# Patient Record
Sex: Female | Born: 2000 | Hispanic: Yes | Marital: Single | Smoking: Never smoker
Health system: Southern US, Community
[De-identification: ages and names within clinical notes are randomized; demographics above are authoritative.]

## PROBLEM LIST (undated history)

## (undated) ENCOUNTER — Inpatient Hospital Stay (HOSPITAL_COMMUNITY): Payer: Self-pay

## (undated) DIAGNOSIS — J45909 Unspecified asthma, uncomplicated: Secondary | ICD-10-CM

## (undated) DIAGNOSIS — H9190 Unspecified hearing loss, unspecified ear: Secondary | ICD-10-CM

## (undated) DIAGNOSIS — IMO0001 Reserved for inherently not codable concepts without codable children: Secondary | ICD-10-CM

## (undated) HISTORY — PX: NO PAST SURGERIES: SHX2092

---
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
## 2021-01-19 ENCOUNTER — Emergency Department (HOSPITAL_BASED_OUTPATIENT_CLINIC_OR_DEPARTMENT_OTHER): Payer: Medicaid Other

## 2021-01-19 ENCOUNTER — Emergency Department (HOSPITAL_BASED_OUTPATIENT_CLINIC_OR_DEPARTMENT_OTHER)
Admission: EM | Admit: 2021-01-19 | Discharge: 2021-01-19 | Disposition: A | Discharge status: Home / Self Care | Payer: Medicaid Other | Attending: Emergency Medicine | Admitting: Emergency Medicine

## 2021-01-19 ENCOUNTER — Encounter (HOSPITAL_BASED_OUTPATIENT_CLINIC_OR_DEPARTMENT_OTHER): Payer: Self-pay

## 2021-01-19 ENCOUNTER — Other Ambulatory Visit: Payer: Self-pay

## 2021-01-19 DIAGNOSIS — S6991XA Unspecified injury of right wrist, hand and finger(s), initial encounter: Secondary | ICD-10-CM | POA: Diagnosis present

## 2021-01-19 DIAGNOSIS — S61212A Laceration without foreign body of right middle finger without damage to nail, initial encounter: Secondary | ICD-10-CM | POA: Insufficient documentation

## 2021-01-19 DIAGNOSIS — W232XXA Caught, crushed, jammed or pinched between a moving and stationary object, initial encounter: Secondary | ICD-10-CM | POA: Diagnosis not present

## 2021-01-19 DIAGNOSIS — Z23 Encounter for immunization: Secondary | ICD-10-CM | POA: Diagnosis not present

## 2021-01-19 DIAGNOSIS — Y990 Civilian activity done for income or pay: Secondary | ICD-10-CM | POA: Diagnosis not present

## 2021-01-19 DIAGNOSIS — S61209A Unspecified open wound of unspecified finger without damage to nail, initial encounter: Secondary | ICD-10-CM

## 2021-01-19 DIAGNOSIS — J45909 Unspecified asthma, uncomplicated: Secondary | ICD-10-CM | POA: Diagnosis not present

## 2021-01-19 MED ORDER — BUPIVACAINE HCL (PF) 0.5 % IJ SOLN
10.0000 mL | Freq: Once | INTRAMUSCULAR | Status: AC
  Administered 2021-01-19: 10 mL
  Filled 2021-01-19: qty 10

## 2021-01-19 MED ORDER — CEPHALEXIN 500 MG PO CAPS: 500.0000 mg | ORAL_CAPSULE | Freq: Four times a day (QID) | ORAL | 0 refills | Status: AC

## 2021-01-19 MED ORDER — CEFAZOLIN SODIUM-DEXTROSE 1-4 GM/50ML-% IV SOLN
1.0000 g | Freq: Once | INTRAVENOUS | Status: AC
  Administered 2021-01-19: 1 g via INTRAVENOUS
  Filled 2021-01-19: qty 50

## 2021-01-19 MED ORDER — TETANUS-DIPHTH-ACELL PERTUSSIS 5-2.5-18.5 LF-MCG/0.5 IM SUSY
0.5000 mL | PREFILLED_SYRINGE | Freq: Once | INTRAMUSCULAR | Status: AC
  Administered 2021-01-19: 0.5 mL via INTRAMUSCULAR
  Filled 2021-01-19: qty 0.5

## 2021-01-19 MED ORDER — SODIUM CHLORIDE 0.9 % IV SOLN: INTRAVENOUS | Status: DC | PRN

## 2021-01-19 NOTE — ED Triage Notes (Signed)
Pt reports she got right middle finger caught in a gate at work ~1pm-partial tip avulsion with nail involvement-dsg 4x4/kling placed in triage-bleeding controled-NAD-steady gait

## 2021-01-19 NOTE — ED Provider Notes (Signed)
[facility] EMERGENCY DEPARTMENT Provider Note   CSN: [id number] Arrival date & time: 01/19/21  1329     History Chief Complaint  Patient presents with   Finger Injury    Crystal Jennings is a 20 y.o. female.  HPI  Patient is a 20 year old female who presents to the emergency department due to a laceration to the right middle finger.  Patient states this occurred at work earlier today.  They are a large metal doors at her work and her finger was caught between 2 doors and crushed the distal tip of the finger resulting in a laceration.  Reports mild bleeding from the site.  Reports decreased sensation to the avulsed tip of the finger.  Notes fingernail involvement.  Unsure of the timing of her last Tdap.  No other complaints.     Past Medical History:  Diagnosis Date   Asthma    Hearing impairment     There are no problems to display for this patient.   History reviewed. No pertinent surgical history.   OB History   No obstetric history on file.     No family history on file.  Social History   Tobacco Use   Smoking status: Never   Smokeless tobacco: Never  Vaping Use   Vaping Use: Never used  Substance Use Topics   Alcohol use: Never   Drug use: Never    Home Medications Prior to Admission medications   Medication Sig Start Date End Date Taking? Authorizing Provider  cephALEXin (KEFLEX) 500 MG capsule Take 1 capsule (500 mg total) by mouth 4 (four) times daily for 7 days. 01/19/21 01/26/21 Yes Placido Sou, PA-C    Allergies    Patient has no known allergies.  Review of Systems   Review of Systems  All other systems reviewed and are negative. Ten systems reviewed and are negative for acute change, except as noted in the HPI.   Physical Exam Updated Vital Signs BP 115/74   Pulse 73   Temp 98.4 F (36.9 C) (Oral)   Resp 16   Ht 5\' 2"  (1.575 m)   Wt 83.5 kg   LMP 01/09/2021   SpO2 100%   BMI 33.65 kg/m   Physical  Exam Vitals and nursing note reviewed.  Constitutional:      General: She is not in acute distress.    Appearance: Normal appearance. She is not ill-appearing, toxic-appearing or diaphoretic.  HENT:     Head: Normocephalic and atraumatic.     Right Ear: External ear normal.     Left Ear: External ear normal.     Nose: Nose normal.     Mouth/Throat:     Mouth: Mucous membranes are moist.     Pharynx: Oropharynx is clear. No oropharyngeal exudate or posterior oropharyngeal erythema.  Eyes:     Extraocular Movements: Extraocular movements intact.  Cardiovascular:     Rate and Rhythm: Normal rate and regular rhythm.     Pulses: Normal pulses.     Heart sounds: Normal heart sounds. No murmur heard.   No friction rub. No gallop.  Pulmonary:     Effort: Pulmonary effort is normal. No respiratory distress.     Breath sounds: Normal breath sounds. No stridor. No wheezing, rhonchi or rales.  Abdominal:     General: Abdomen is flat.     Tenderness: There is no abdominal tenderness.  Musculoskeletal:        General: Normal range of motion.  Cervical back: Normal range of motion and neck supple. No tenderness.  Skin:    General: Skin is warm and dry.     Comments: Please see images below of the right middle finger.  2+ radial pulses.  Subjective decrease in sensation along the distal tip of the finger.  Neurological:     General: No focal deficit present.     Mental Status: She is alert and oriented to person, place, and time.  Psychiatric:        Mood and Affect: Mood normal.        Behavior: Behavior normal.        ED Results / Procedures / Treatments   Labs (all labs ordered are listed, but only abnormal results are displayed) Labs Reviewed - No data to display  EKG None  Radiology DG Finger Middle Right  Result Date: 01/19/2021 CLINICAL DATA:  Injury Third digit caught in gait at work Partial tip avulsion EXAM: RIGHT MIDDLE FINGER 2+V COMPARISON:  None. FINDINGS:  Deformity of the tip of the third digit consistent with provided history of laceration/amputation. Small avulsion type fracture noted at the distal tip of the distal phalanx of the third digit. IMPRESSION: Small avulsion fracture of the distal tip of the distal phalanx of the third digit. Electronically Signed   By: Acquanetta Belling M.D.   On: 01/19/2021 14:10    Procedures Procedures   Medications Ordered in ED Medications  0.9 %  sodium chloride infusion (0 mLs Intravenous Stopped 01/19/21 1818)  bupivacaine (MARCAINE) 0.5 % injection 10 mL (10 mLs Infiltration Given by Other 01/19/21 1538)  Tdap (BOOSTRIX) injection 0.5 mL (0.5 mLs Intramuscular Given 01/19/21 1539)  bupivacaine (MARCAINE) 0.5 % injection 10 mL (10 mLs Infiltration Given by Other 01/19/21 1606)  ceFAZolin (ANCEF) IVPB 1 g/50 mL premix (0 g Intravenous Stopped 01/19/21 1818)   ED Course  I have reviewed the triage vital signs and the nursing notes.  Pertinent labs & imaging results that were available during my care of the patient were reviewed by me and considered in my medical decision making (see chart for details).  Clinical Course as of 01/19/21 1818  Thu Jan 19, 2021  1712 Patient discussed with Dr. Yehuda Budd with hand surgery.  He reviewed patient's imaging as well as the images of her injury that I placed in her chart.  Recommends that I remove the avulsed portion of the fingernail, clean the finger, and approximate with Tegaderm and wrapped it with Coban.  Request that the patient reach out to him first thing tomorrow morning to schedule an appointment for reevaluation. [LJ]    Clinical Course User Index [LJ] Placido Sou, PA-C   MDM Rules/Calculators/A&P                          Patient is a 20 year old female who presents to the emergency department due to what appears to be an avulsion laceration to the distal tip of the right middle finger.  This occurred at work after a metal machine door crushed the end of her  finger.  X-rays obtained showing small avulsion fracture of the distal tip of the distal phalanx of the third digit.  Please see images above of the wound.  Patient with 2+ radial pulses.  Full range of motion of the finger.  Patient does report decreased sensation along the avulsed tip of the finger.  Patient discussed with Dr. Yehuda Budd who is on-call with hand surgery.  He reviewed her images and recommends the wound be cleaned extensively and covered with Tegaderm and wrapped with Coban.  Recommends follow-up in his office tomorrow.  Wound cleaned extensively with sterile saline as well as Betadine.  Patient given 1 g of Ancef.  After cleaning the wound it appears that the avulsed tip of the fingernail has already been removed.  Wound covered with Tegaderm and wrapped with Coban.  Patient tolerated the procedure quite well.  Analgesia achieved with digital block.  Feel the patient is stable for discharge at this time and she is agreeable.  Will discharge on a course of Keflex.  Patient given the contact information for Dr. Yehuda Budd and urged to reach out to his office first thing tomorrow morning.  Her questions were answered and she was amicable at the time of discharge.  Final Clinical Impression(s) / ED Diagnoses Final diagnoses:  Avulsion of finger, initial encounter    Rx / DC Orders ED Discharge Orders          Ordered    cephALEXin (KEFLEX) 500 MG capsule  4 times daily        01/19/21 1808             Placido Sou, PA-C 01/19/21 1818    Arby Barrette, MD 01/24/21 1104

## 2021-01-19 NOTE — Discharge Instructions (Signed)
I am prescribing you an antibiotic called Keflex.  Please take this 4 times a day for the next week.  Do not stop taking this antibiotic early.  Below is the contact information for Dr. Yehuda Budd.  He is a Producer, television/film/video.  Please call him first thing tomorrow morning to schedule an appointment for reevaluation.  His office is expecting your call.  If you develop any new or worsening symptoms please come back to the emergency department.  It was a pleasure to meet you.

## 2021-06-15 ENCOUNTER — Other Ambulatory Visit: Payer: Self-pay

## 2021-06-15 ENCOUNTER — Emergency Department (HOSPITAL_COMMUNITY)
Admission: EM | Admit: 2021-06-15 | Discharge: 2021-06-15 | Disposition: A | Discharge status: Home / Self Care | Payer: Medicaid Other | Attending: Emergency Medicine | Admitting: Emergency Medicine

## 2021-06-15 DIAGNOSIS — H9201 Otalgia, right ear: Secondary | ICD-10-CM | POA: Insufficient documentation

## 2021-06-15 DIAGNOSIS — Z20822 Contact with and (suspected) exposure to covid-19: Secondary | ICD-10-CM | POA: Insufficient documentation

## 2021-06-15 DIAGNOSIS — J039 Acute tonsillitis, unspecified: Secondary | ICD-10-CM

## 2021-06-15 DIAGNOSIS — J029 Acute pharyngitis, unspecified: Secondary | ICD-10-CM | POA: Diagnosis not present

## 2021-06-15 LAB — RESP PANEL BY RT-PCR (FLU A&B, COVID) ARPGX2
Influenza A by PCR: NEGATIVE
Influenza B by PCR: NEGATIVE
SARS Coronavirus 2 by RT PCR: NEGATIVE

## 2021-06-15 LAB — GROUP A STREP BY PCR: Group A Strep by PCR: NOT DETECTED

## 2021-06-15 MED ORDER — DEXAMETHASONE SODIUM PHOSPHATE 10 MG/ML IJ SOLN
10.0000 mg | Freq: Once | INTRAMUSCULAR | Status: AC
Start: 2021-06-15 — End: 2021-06-15
  Administered 2021-06-15: 10 mg via INTRAMUSCULAR
  Filled 2021-06-15: qty 1

## 2021-06-15 MED ORDER — LIDOCAINE VISCOUS HCL 2 % MT SOLN
15.0000 mL | Freq: Once | OROMUCOSAL | Status: AC
Start: 2021-06-15 — End: 2021-06-15
  Administered 2021-06-15: 15 mL via OROMUCOSAL
  Filled 2021-06-15: qty 15

## 2021-06-15 MED ORDER — KETOROLAC TROMETHAMINE 60 MG/2ML IM SOLN
60.0000 mg | Freq: Once | INTRAMUSCULAR | Status: AC
  Administered 2021-06-15: 60 mg via INTRAMUSCULAR
  Filled 2021-06-15: qty 2

## 2021-06-15 MED ORDER — IBUPROFEN 800 MG PO TABS: 800.0000 mg | ORAL_TABLET | Freq: Three times a day (TID) | ORAL | 0 refills | Status: DC

## 2021-06-15 NOTE — ED Provider Notes (Signed)
?[facility] EMERGENCY DEPARTMENT ?Provider Note ? ? ?CSN: [id number] ?Arrival date & time: 06/15/21  0746 ? ?  ? ?History ? ?Chief Complaint  ?Patient presents with  ? Sore Throat  ? ? ?Crystal Jennings is a 21 y.o. female who presents emergency department with chief complaint of sore throat.  She had onset of sore throat yesterday.  Her girlfriend who is at bedside states that she looked in the back of her throat and noticed that her tonsils were big.  This morning she came in for further evaluation.  She has some referred pain in her right ear and pain with swallowing and has noticed a change in her voice.  She denies fever, chills, nausea, vomiting, exposure to contacts with similar symptoms.  She does not have a history of strep throat. ? ? ?Sore Throat ?Pertinent negatives include no shortness of breath.  ? ?  ? ?Home Medications ?Prior to Admission medications   ?Not on File  ?   ? ?Allergies    ?Patient has no known allergies.   ? ?Review of Systems   ?Review of Systems  ?Constitutional:  Negative for fever.  ?HENT:  Positive for sore throat and trouble swallowing.   ?Respiratory:  Negative for choking, shortness of breath, wheezing and stridor.   ? ?Physical Exam ?Updated Vital Signs ?BP 121/84 (BP Location: Right Arm)   Pulse 99   Temp 98 ?F (36.7 ?C) (Oral)   Resp 16   SpO2 100%  ?Physical Exam ?Vitals and nursing note reviewed.  ?Constitutional:   ?   General: She is not in acute distress. ?   Appearance: She is well-developed. She is not diaphoretic.  ?HENT:  ?   Head: Normocephalic and atraumatic.  ?   Right Ear: External ear normal.  ?   Left Ear: External ear normal.  ?   Nose: Nose normal.  ?   Mouth/Throat:  ?   Mouth: Mucous membranes are moist.  ?   Pharynx: Pharyngeal swelling, oropharyngeal exudate and posterior oropharyngeal erythema present. No uvula swelling.  ?Eyes:  ?   General: No scleral icterus. ?   Conjunctiva/sclera: Conjunctivae normal.  ?Cardiovascular:   ?   Rate and Rhythm: Normal rate and regular rhythm.  ?   Heart sounds: Normal heart sounds. No murmur heard. ?  No friction rub. No gallop.  ?Pulmonary:  ?   Effort: Pulmonary effort is normal. No respiratory distress.  ?   Breath sounds: Normal breath sounds.  ?Abdominal:  ?   General: Bowel sounds are normal. There is no distension.  ?   Palpations: Abdomen is soft. There is no mass.  ?   Tenderness: There is no abdominal tenderness. There is no guarding.  ?Musculoskeletal:  ?   Cervical back: Normal range of motion.  ?Skin: ?   General: Skin is warm and dry.  ?Neurological:  ?   Mental Status: She is alert and oriented to person, place, and time.  ?Psychiatric:     ?   Behavior: Behavior normal.  ? ? ?ED Results / Procedures / Treatments   ?Labs ?(all labs ordered are listed, but only abnormal results are displayed) ?Labs Reviewed  ?GROUP A STREP BY PCR  ?RESP PANEL BY RT-PCR (FLU A&B, COVID) [id number]  ? ? ?EKG ?None ? ?Radiology ?No results found. ? ?Procedures ?Procedures  ? ? ?Medications Ordered in ED ?Medications  ?dexamethasone (DECADRON) injection 10 mg (10 mg Intramuscular Given 06/15/21 0854)  ?ketorolac (TORADOL) injection  60 mg (60 mg Intramuscular Given 06/15/21 0854)  ?lidocaine (XYLOCAINE) 2 % viscous mouth solution 15 mL (15 mLs Mouth/Throat Given 06/15/21 0854)  ? ? ?ED Course/ Medical Decision Making/ A&P ?Clinical Course as of 06/15/21 0927  ?Thu Jun 15, 2021  ?0926 Resp Panel by RT-PCR (Flu A&B, Covid) Nasopharyngeal Swab [AH]  ?0973 Group A Strep by PCR ?Respiratory panel and group A strep negative [AH]  ?  ?Clinical Course User Index ?[AH] Arthor Captain, PA-C  ? ?                        ?Medical Decision Making ?Patient here with tonsillar swelling and exudate. ?No strep detected.  Patient had significant relief with Decadron, Toradol and viscous lidocaine.  The patient has no evidence of peritonsillar abscess.  Patient will be discharged with symptomatic treatment. ? ?Amount and/or Complexity  of Data Reviewed ?Labs: ordered. Decision-making details documented in ED Course. ? ?Risk ?Prescription drug management. ? ? ? ? ? ?  ? ? ? ? ?Final Clinical Impression(s) / ED Diagnoses ?Final diagnoses:  ?None  ? ? ?Rx / DC Orders ?ED Discharge Orders   ? ? None  ? ?  ? ? ?  ?Arthor Captain, PA-C ?06/15/21 5329 ? ?  ?Franne Forts, DO ?06/16/21 [phone] ? ?

## 2021-06-15 NOTE — ED Triage Notes (Signed)
Pt from home for sore throat/swollen tonsils since yesterday. Endorse pain that radiates to R ear.  ?

## 2021-06-15 NOTE — Discharge Instructions (Addendum)
Contact a health care provider if: You notice large, tender lumps in your neck that were not there before. You have a fever that does not go away after 2-3 days. You develop a rash. You cough up a green, yellow-brown, or bloody substance. You cannot swallow liquids or food for 24 hours. Only one of your tonsils is swollen. Get help right away if: You develop any new symptoms, such as vomiting, severe headache, stiff neck, chest pain, trouble breathing, or trouble swallowing. You have severe throat pain along with drooling or voice changes. You have severe pain that is not controlled with medicines. You cannot fully open your mouth. You develop redness, swelling, or severe pain anywhere in your neck. 

## 2022-09-25 ENCOUNTER — Emergency Department (HOSPITAL_COMMUNITY)
Admission: EM | Admit: 2022-09-25 | Discharge: 2022-09-25 | Disposition: A | Discharge status: Home / Self Care | Payer: Medicaid Other | Attending: Emergency Medicine | Admitting: Emergency Medicine

## 2022-09-25 ENCOUNTER — Encounter (HOSPITAL_COMMUNITY): Payer: Self-pay

## 2022-09-25 DIAGNOSIS — Z1152 Encounter for screening for COVID-19: Secondary | ICD-10-CM | POA: Diagnosis not present

## 2022-09-25 DIAGNOSIS — R251 Tremor, unspecified: Secondary | ICD-10-CM | POA: Diagnosis present

## 2022-09-25 LAB — URINALYSIS, ROUTINE W REFLEX MICROSCOPIC
Glucose, UA: NEGATIVE mg/dL
Hgb urine dipstick: NEGATIVE
Ketones, ur: NEGATIVE mg/dL
Nitrite: NEGATIVE
Protein, ur: 30 mg/dL — AB
Specific Gravity, Urine: 1.029 (ref 1.005–1.030)
pH: 5 (ref 5.0–8.0)

## 2022-09-25 LAB — CBC WITH DIFFERENTIAL/PLATELET
Abs Immature Granulocytes: 0.05 10*3/uL (ref 0.00–0.07)
Basophils Absolute: 0.1 10*3/uL (ref 0.0–0.1)
Basophils Relative: 1 %
Eosinophils Absolute: 0.1 10*3/uL (ref 0.0–0.5)
Eosinophils Relative: 1 %
HCT: 43.2 % (ref 36.0–46.0)
Hemoglobin: 14.5 g/dL (ref 12.0–15.0)
Immature Granulocytes: 0 %
Lymphocytes Relative: 40 %
Lymphs Abs: 4.5 10*3/uL — ABNORMAL HIGH (ref 0.7–4.0)
MCH: 29.2 pg (ref 26.0–34.0)
MCHC: 33.6 g/dL (ref 30.0–36.0)
MCV: 87.1 fL (ref 80.0–100.0)
Monocytes Absolute: 1 10*3/uL (ref 0.1–1.0)
Monocytes Relative: 9 %
Neutro Abs: 5.6 10*3/uL (ref 1.7–7.7)
Neutrophils Relative %: 49 %
Platelets: 404 10*3/uL — ABNORMAL HIGH (ref 150–400)
RBC: 4.96 MIL/uL (ref 3.87–5.11)
RDW: 13.9 % (ref 11.5–15.5)
WBC: 11.4 10*3/uL — ABNORMAL HIGH (ref 4.0–10.5)
nRBC: 0 % (ref 0.0–0.2)

## 2022-09-25 LAB — COMPREHENSIVE METABOLIC PANEL
ALT: 36 U/L (ref 0–44)
AST: 34 U/L (ref 15–41)
Albumin: 3.9 g/dL (ref 3.5–5.0)
Alkaline Phosphatase: 61 U/L (ref 38–126)
Anion gap: 10 (ref 5–15)
BUN: 9 mg/dL (ref 6–20)
CO2: 26 mmol/L (ref 22–32)
Calcium: 9.3 mg/dL (ref 8.9–10.3)
Chloride: 102 mmol/L (ref 98–111)
Creatinine, Ser: 0.92 mg/dL (ref 0.44–1.00)
GFR, Estimated: >60 mL/min (ref >60)
Glucose, Bld: 98 mg/dL (ref 70–99)
Potassium: 3.6 mmol/L (ref 3.5–5.1)
Sodium: 138 mmol/L (ref 135–145)
Total Bilirubin: 1 mg/dL (ref 0.3–1.2)
Total Protein: 7.5 g/dL (ref 6.5–8.1)

## 2022-09-25 LAB — LIPASE, BLOOD: Lipase: 29 U/L (ref 11–51)

## 2022-09-25 LAB — SARS CORONAVIRUS 2 BY RT PCR: SARS Coronavirus 2 by RT PCR: NEGATIVE

## 2022-09-25 LAB — PREGNANCY, URINE: Preg Test, Ur: NEGATIVE

## 2022-09-25 NOTE — ED Provider Notes (Signed)
[facility] EMERGENCY DEPARTMENT AT [facility] Provider Note   CSN: [id number] Arrival date & time: 09/25/22  1530     History  Chief Complaint  Patient presents with   Shaking    Crystal Jennings is a 22 y.o. female with no past medical history presented with multiple concerns that began yesterday.  Patient states that she was very anxious going to work and began shaking.  The shaking was on the entire day and patient states this was from her anxiety.  Patient and her friend deny seizure-like activity or history of seizures.  Patient also notes that she has acid reflux but is not on any medications for it.  Patient states that her acid reflux is intermittent and has been present for the past few weeks but denies any chest pain.  Patient denies recent illness, fevers, chest pain, shortness of breath, LOC, blood thinners, new medications, drug use, thyroid issues, alcohol use, no history of seizures  Home Medications Prior to Admission medications   Medication Sig Start Date End Date Taking? Authorizing Provider  ibuprofen (ADVIL) 800 MG tablet Take 1 tablet (800 mg total) by mouth 3 (three) times daily. 06/15/21   Arthor Captain, PA-C      Allergies    Patient has no known allergies.    Review of Systems   Review of Systems See HPI Physical Exam Updated Vital Signs BP 113/66   Pulse (!) 58   Temp 98 F (36.7 C) (Oral)   Resp 16   SpO2 99%  Physical Exam Vitals reviewed.  Constitutional:      General: She is not in acute distress.    Comments: On phone  HENT:     Head: Normocephalic and atraumatic.  Eyes:     Extraocular Movements: Extraocular movements intact.     Conjunctiva/sclera: Conjunctivae normal.     Pupils: Pupils are equal, round, and reactive to light.     Comments: No protruding eyes  Neck:     Comments: No goiter Cardiovascular:     Rate and Rhythm: Normal rate and regular rhythm.     Pulses: Normal pulses.     Heart sounds:  Normal heart sounds.     Comments: 2+ bilateral radial/dorsalis pedis pulses with regular rate Pulmonary:     Effort: Pulmonary effort is normal. No respiratory distress.     Breath sounds: Normal breath sounds.  Abdominal:     Palpations: Abdomen is soft.     Tenderness: There is no abdominal tenderness. There is no guarding or rebound.  Musculoskeletal:        General: Normal range of motion.     Cervical back: Normal range of motion and neck supple.     Comments: 5 out of 5 bilateral grip/leg extension strength  Skin:    General: Skin is warm and dry.     Capillary Refill: Capillary refill takes less than 2 seconds.  Neurological:     General: No focal deficit present.     Mental Status: She is alert and oriented to person, place, and time.     Comments: Sensation intact in all 4 limbs No tremors noted  Psychiatric:        Mood and Affect: Mood normal.     ED Results / Procedures / Treatments   Labs (all labs ordered are listed, but only abnormal results are displayed) Labs Reviewed  URINALYSIS, ROUTINE W REFLEX MICROSCOPIC - Abnormal; Notable for the following components:  Result Value   Color, Urine AMBER (*)    APPearance TURBID (*)    Bilirubin Urine SMALL (*)    Protein, ur 30 (*)    Leukocytes,Ua SMALL (*)    Bacteria, UA RARE (*)    All other components within normal limits  CBC WITH DIFFERENTIAL/PLATELET - Abnormal; Notable for the following components:   WBC 11.4 (*)    Platelets 404 (*)    Lymphs Abs 4.5 (*)    All other components within normal limits  SARS CORONAVIRUS 2 BY RT PCR  PREGNANCY, URINE  COMPREHENSIVE METABOLIC PANEL  LIPASE, BLOOD    EKG None  Radiology No results found.  Procedures Procedures    Medications Ordered in ED Medications - No data to display  ED Course/ Medical Decision Making/ A&P                             Medical Decision Making Amount and/or Complexity of Data Reviewed Labs: ordered.   Crystal C  Jennings 21 y.o. presented today for shaking. Working DDx that I considered at this time includes, but not limited to, anxiety, electrolyte abnormalities, thyroid abnormalities, viral illness, UTI, Parkinson's, alcohol withdrawal.  R/o DDx: electrolyte abnormalities, thyroid abnormalities, viral illness, UTI, Parkinson's, alcohol withdrawal: These are considered less likely due to history of present illness and physical exam findings  Review of prior external notes: 06/15/2021 ED  Unique Tests and My Interpretation:  COVID: Pending Lipase: Unremarkable CMP: Unremarkable CBC: Unremarkable Urine pregnancy: Negative UA: Unremarkable  Discussion with Independent Historian:  Friend  Discussion of Management of Tests: None  Risk: Low: based on diagnostic testing/clinical impression and treatment plan  Risk Stratification Score: None  Plan: On exam patient was in no acute distress stable vitals.  Patient physical was unremarkable as she had no abdominal tenderness and did not have any tremors or other concerning features on her exam.  Patient stated that she has history of acid reflux but is not currently having any symptoms at this moment.  Patient thinks that her shaking yesterday was due to anxiety as she states she was super anxious about going into work and that the shaking stopped after she left work.  Patient's labs came back reassuring and I spoke to the patient and she states that she is good to be discharged and needs to be given primary care provider to follow-up with.  Patient given primary care follow-up and encouraged to return to ER if symptoms change or worsen.  I encouraged patient to follow-up on the MyChart app in terms of her COVID results.  Patient was given return precautions. Patient stable for discharge at this time.  Patient verbalized understanding of plan.         Final Clinical Impression(s) / ED Diagnoses Final diagnoses:  Tremor    Rx / DC  Orders ED Discharge Orders     None         Remi Deter 09/25/22 2152    Crystal Grandchild, MD 09/26/22 2221

## 2022-09-25 NOTE — ED Notes (Signed)
Patient arrived to room ambulatory with steady gait. Patient reports after work today she had "shaking" spell not like a seizure just arm shaking. Patient states 3 week hx of heartburn and decreased appetite. Patient denies n/v/d and has no abdominal pain. Bowel sounds audible in all quads. Patient a/o x 4 respirations even and non labored.

## 2022-09-25 NOTE — ED Triage Notes (Signed)
Pt states yesterday when she got off work she had a spell of shaking but not like a seizure, and has had a reduction in her appetite. Also complains of some intermittent heart burn over the past several weeks. No Hx of diabetes.

## 2022-09-25 NOTE — Discharge Instructions (Signed)
Please follow-up with a primary care provider of your choosing within 1 day provide here for you in regards to your recent ER visit and symptoms.  Today your labs are reassuring and you may follow-up on the COVID results on the MyChart app.  Please monitor your symptoms and if symptoms change or worsen please return to ER.

## 2023-01-06 IMAGING — CR DG FINGER MIDDLE 2+V*R*
3 series · 3 of 3 positions shown · non-contrast
Comparison: None.

CLINICAL DATA: Injury

Third digit caught in gait at work
Partial tip avulsion
EXAM:
RIGHT MIDDLE FINGER 2+V

[x finger pa right]
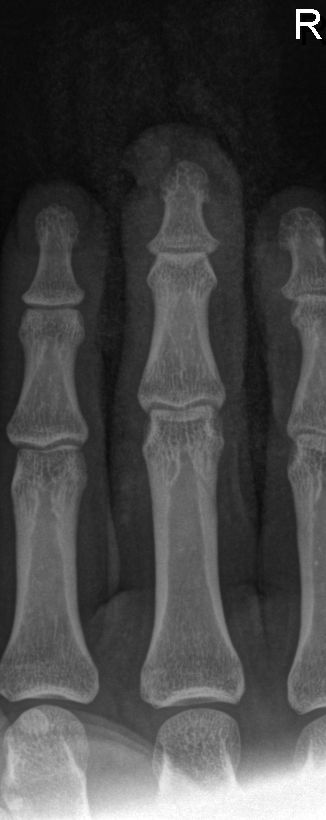

[x finger obl. right]
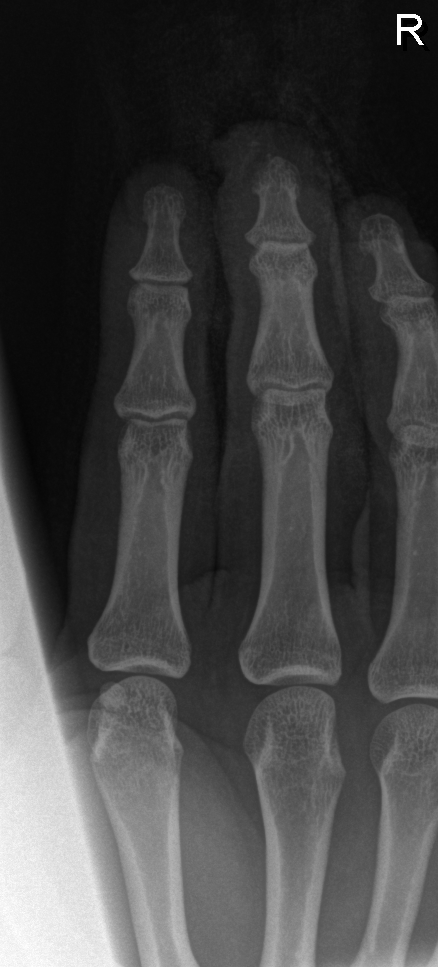

[x finger lateral right]
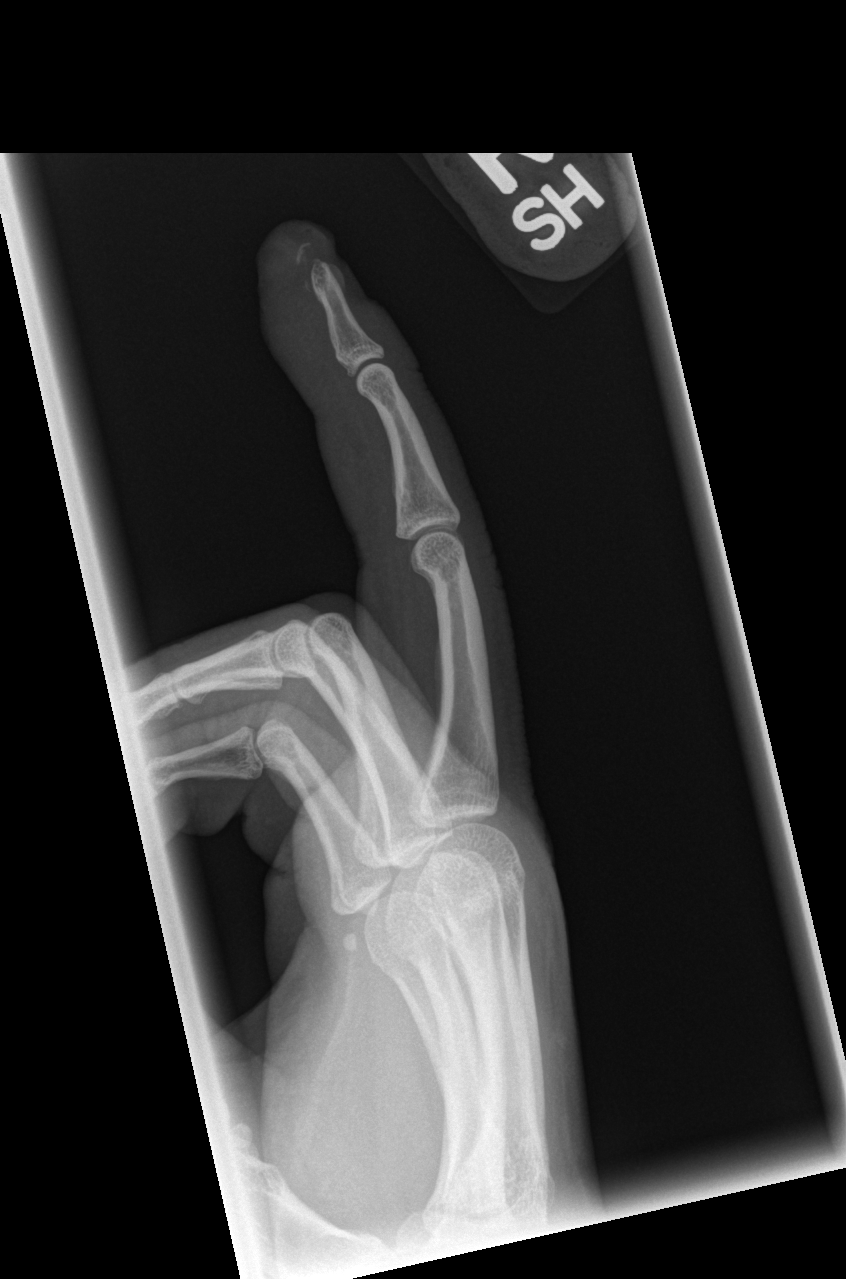

[3 of 3 positions shown; findings below may reference images not displayed]

FINDINGS: Deformity of the tip of the third digit consistent with provided
history of laceration/amputation.

Small avulsion type fracture noted at the distal tip of the distal
phalanx of the third digit.
IMPRESSION: Small avulsion fracture of the distal tip of the distal phalanx of
the third digit.

## 2023-01-13 ENCOUNTER — Emergency Department (HOSPITAL_COMMUNITY)
Admission: EM | Admit: 2023-01-13 | Discharge: 2023-01-13 | Disposition: A | Discharge status: Home / Self Care | Payer: Medicaid Other | Attending: Student | Admitting: Student

## 2023-01-13 DIAGNOSIS — S0990XA Unspecified injury of head, initial encounter: Secondary | ICD-10-CM | POA: Insufficient documentation
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
## 2023-01-13 NOTE — Discharge Instructions (Addendum)
You may have a concussion. You  may take Tylenol 650 mg every 4 hours as needed for headache. It is very important that you rest over the next 48 hours. You should also not participate in any exercise or sports for at least 7 days and until you have no symptoms including headache, dizziness, nausea, or vomiting. Once your symptoms have resolved and you have gone at least 7 days following your injury, you may gradually return to light exercise as directed by your regular primary care provider. Return to the emergency department for repeat evaluation if you develop more than 2 episodes of vomiting, worsening headache despite use of Tylenol, difficulties with speech or balance, or other new concerns.

## 2023-03-20 NOTE — L&D Delivery Note (Addendum)
 OB/GYN Faculty Practice Delivery Note  Crystal Jennings is a 23 y.o. G1P0 s/p NSVD at [redacted]w[redacted]d. She was admitted for SOL.   ROM: 4h 66m with meconium stained fluid GBS Status:  Positive/-- (06/30 1427) Maximum Maternal Temperature:  Temp (24hrs), Avg:97.9 F (36.6 C), Min:97.5 F (36.4 C), Max:98.3 F (36.8 C)    Labor Progress: Patient arrived at 5.5 cm dilation and was followed with expectant management. SROM at 0600. Pt had a prolonged decel after getting the epidural and got terb.   Delivery Date/Time: 09/30/2023 at 1031 Delivery: Called to room and patient was complete and pushing. Head delivered in ROA position. Loose double  nuchal cord present. Shoulder and body delivered in usual fashion. Infant with spontaneous cry, placed on mother's abdomen, dried and stimulated. Cord clamped x 2 after 1-minute delay, and cut by provider. Cord blood drawn. Placenta delivered spontaneously with gentle cord traction. Fundus firm with massage and Pitocin . Labia, perineum, vagina, and cervix inspected. CNM was present and supervised to the entire delivery.   Placenta: manual removal, intact Complications: none  Lacerations: 2nd degree, repaired with 3-0 vicryl on a CT-1 in the usual fashion EBL: 376cc Analgesia: epidural    Infant: APGAR (1 MIN): 7  APGAR (5 MINS): 9  APGAR (10 MINS):    Weight: pending  Chiquita Clover, MD  PGY-3 [facility] Resident 09/30/2023 11:06 AM  I was present for the entire delivery of baby and placenta and performed manual removal of placenta and perineal repair and agree with above.  Claudene Windell Musson , CNM 09/30/2023 12:02 PM

## 2023-07-17 ENCOUNTER — Encounter

## 2023-07-22 ENCOUNTER — Ambulatory Visit (INDEPENDENT_AMBULATORY_CARE_PROVIDER_SITE_OTHER): Admitting: Obstetrics

## 2023-07-22 ENCOUNTER — Other Ambulatory Visit: Payer: Self-pay

## 2023-07-22 ENCOUNTER — Other Ambulatory Visit

## 2023-07-22 ENCOUNTER — Other Ambulatory Visit (HOSPITAL_COMMUNITY)
Admission: RE | Admit: 2023-07-22 | Discharge: 2023-07-22 | Disposition: A | Discharge status: Home / Self Care | Source: Ambulatory Visit | Attending: Obstetrics | Admitting: Obstetrics

## 2023-07-22 ENCOUNTER — Encounter: Payer: Self-pay | Admitting: Obstetrics

## 2023-07-22 VITALS — BP 125/88 | HR 81 | Wt 191.8 lb

## 2023-07-22 DIAGNOSIS — Z3480 Encounter for supervision of other normal pregnancy, unspecified trimester: Secondary | ICD-10-CM

## 2023-07-22 DIAGNOSIS — O09513 Supervision of elderly primigravida, third trimester: Secondary | ICD-10-CM

## 2023-07-22 DIAGNOSIS — O0990 Supervision of high risk pregnancy, unspecified, unspecified trimester: Secondary | ICD-10-CM | POA: Diagnosis not present

## 2023-07-22 DIAGNOSIS — J301 Allergic rhinitis due to pollen: Secondary | ICD-10-CM

## 2023-07-22 DIAGNOSIS — Z31430 Encounter of female for testing for genetic disease carrier status for procreative management: Secondary | ICD-10-CM

## 2023-07-22 MED ORDER — CETIRIZINE HCL 10 MG PO TABS: 10.0000 mg | ORAL_TABLET | Freq: Every day | ORAL | 11 refills | Status: DC

## 2023-07-22 MED ORDER — PREPLUS 27-1 MG PO TABS
1.0000 | ORAL_TABLET | Freq: Every day | ORAL | 13 refills | Status: AC
Start: 2023-07-22 — End: ?

## 2023-07-22 NOTE — Progress Notes (Signed)
 Pt presents for new ob.pt has no questions or concerns at this time.

## 2023-07-22 NOTE — Progress Notes (Signed)
 Subjective:    Crystal Jennings is being seen today for her first obstetrical visit.  This is not a planned pregnancy. She is at Unknown gestation. Her obstetrical history is significant for  none . Relationship with FOB: significant other, not living together. Patient does intend to breast feed. Pregnancy history fully reviewed.  The information documented in the HPI was reviewed and verified.  Menstrual History: OB History     Gravida  1   Para      Term      Preterm      AB      Living         SAB      IAB      Ectopic      Multiple      Live Births               Patient's last menstrual period was 12/18/2022.    Past Medical History:  Diagnosis Date   Asthma    Hearing impairment     History reviewed. No pertinent surgical history.  (Not in a hospital admission)  No Known Allergies  Social History   Tobacco Use   Smoking status: Never   Smokeless tobacco: Never  Substance Use Topics   Alcohol use: Never    History reviewed. No pertinent family history.   Review of Systems Constitutional: negative for weight loss Gastrointestinal: negative for vomiting Genitourinary:negative for genital lesions and vaginal discharge and dysuria Musculoskeletal:negative for back pain Behavioral/Psych: negative for abusive relationship, depression, illegal drug usage and tobacco use    Objective:    BP 125/88   Pulse 81   Wt 191 lb 12.8 oz (87 kg)   LMP 12/18/2022   BMI 35.08 kg/m  General Appearance:    Alert, cooperative, no distress, appears stated age  Head:    Normocephalic, without obvious abnormality, atraumatic  Eyes:    PERRL, conjunctiva/corneas clear, EOM's intact, fundi    benign, both eyes  Ears:    Normal TM's and external ear canals, both ears  Nose:   Nares normal, septum midline, mucosa normal, no drainage    or sinus tenderness  Throat:   Lips, mucosa, and tongue normal; teeth and gums normal  Neck:   Supple, symmetrical,  trachea midline, no adenopathy;    thyroid:  no enlargement/tenderness/nodules; no carotid   bruit or JVD  Back:     Symmetric, no curvature, ROM normal, no CVA tenderness  Lungs:     Clear to auscultation bilaterally, respirations unlabored  Chest Wall:    No tenderness or deformity   Heart:    Regular rate and rhythm, S1 and S2 normal, no murmur, rub   or gallop  Breast Exam:    No tenderness, masses, or nipple abnormality  Abdomen:     Soft, non-tender, bowel sounds active all four quadrants,    no masses, no organomegaly  Genitalia:    Normal female without lesion, discharge or tenderness  Extremities:   Extremities normal, atraumatic, no cyanosis or edema  Pulses:   2+ and symmetric all extremities  Skin:   Skin color, texture, turgor normal, no rashes or lesions  Lymph nodes:   Cervical, supraclavicular, and axillary nodes normal  Neurologic:   CNII-XII intact, normal strength, sensation and reflexes    throughout      Lab Review Urine pregnancy test Labs reviewed yes Radiologic studies reviewed yes  Assessment:   1. Supervision of high risk pregnancy, antepartum (Primary)  Rx: - Glucose Tolerance, 2 Hours w/1 Hour - CBC - HIV antibody (with reflex) - RPR - Cytology - PAP( [name]) - Cervicovaginal ancillary only( [name]) - Culture, OB Urine - CBC/D/Plt+RPR+Rh+ABO+RubIgG... - US  MFM OB DETAIL +14 WK; Future - Prenatal Vit-Fe Fumarate-FA (PREPLUS) 27-1 MG TABS; Take 1 tablet by mouth daily.  Dispense: 30 tablet; Refill: 13  2. Primigravida of advanced maternal age in third trimester  3. Encounter of female for testing for genetic disease carrier status for procreative management Rx: - HORIZON Basic Panel - PANORAMA PRENATAL TEST  4. Seasonal allergic rhinitis due to pollen Rx: - cetirizine (ZYRTEC) 10 MG tablet; Take 1 tablet (10 mg total) by mouth daily.  Dispense: 30 tablet; Refill: 11    Plan:    Follow up in 2 weeks  Prenatal vitamins.   Counseling provided regarding continued use of seat belts, cessation of alcohol consumption, smoking or use of illicit drugs; infection precautions i.e., influenza/TDAP immunizations, toxoplasmosis,CMV, parvovirus, listeria and varicella; workplace safety, exercise during pregnancy; routine dental care, safe medications, sexual activity, hot tubs, saunas, pools, travel, caffeine use, fish and methlymercury, potential toxins, hair treatments, varicose veins Weight gain recommendations per IOM guidelines reviewed: underweight/BMI< 18.5--> gain 28 - 40 lbs; normal weight/BMI 18.5 - 24.9--> gain 25 - 35 lbs; overweight/BMI 25 - 29.9--> gain 15 - 25 lbs; obese/BMI >30->gain  11 - 20 lbs Problem list reviewed and updated. FIRST/CF mutation testing/NIPT/QUAD SCREEN/fragile X/Ashkenazi Jewish population testing/Spinal muscular atrophy discussed: requested. Role of ultrasound in pregnancy discussed; fetal survey: requested. Amniocentesis discussed: not indicated.  Meds ordered this encounter  Medications   Prenatal Vit-Fe Fumarate-FA (PREPLUS) 27-1 MG TABS    Sig: Take 1 tablet by mouth daily.    Dispense:  30 tablet    Refill:  13   cetirizine (ZYRTEC) 10 MG tablet    Sig: Take 1 tablet (10 mg total) by mouth daily.    Dispense:  30 tablet    Refill:  11   Orders Placed This Encounter  Procedures   Culture, OB Urine   US  MFM OB DETAIL +14 WK    BMI 35, Late to care approximately 26 wks, needs dating    Standing Status:   Future    Expiration Date:   07/21/2024    Reason for Exam (SYMPTOM  OR DIAGNOSIS REQUIRED):   Anatomy scan    Preferred Location:   [facility] Ultrasound   Glucose Tolerance, 2 Hours w/1 Hour   CBC   HIV antibody (with reflex)   RPR   CBC/D/Plt+RPR+Rh+ABO+RubIgG...   HORIZON Basic Panel    ==========Department Information========== ID: [id number] Department:CENTER FOR [facility] FOR [facility] HEALTHCARE AT [facility] [address],  [address] [place] [address] Dept: [phone] Dept Fax: [phone]     Specify the name or ID of a valid Horizon Custom Panel::   HBASIC    Is patient pregnant?:   Yes    Practice ensures that HIPAA consent is obtained and will make available to [facility] upon request?:   Yes    By placing this electronic order I confirm the testing ordered herein is medically necessary and this patient has been informed of the details of the genetic test(s) ordered, including the risks, benefits, and alternatives, and has consented to testing.:   Yes    What type of billing?:   [employer] an  order diagnosis: For additional options refer to [email or web address]:   Encounter of female for testing for genetic disease carrier status for procreative management [phone]    Tay-Sachs add-on test?:   No   PANORAMA PRENATAL TEST    ==========Department Information========== ID: [id number] Department:[facility]ENTER FOR [facility] HEALTHCARE AT [facility] [address], [address] [place] [address] Dept: [phone] Dept Fax: [phone]     Method/type of collection::   Clinic to manage sample collection    Expected due date (MM/DD/YYYY)::   10/28/2023    Is this a twin pregnancy? 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  I have spent a total of 30 minutes of face-to-face time, excluding clinical staff time, reviewing notes and preparing to see patient, ordering tests and/or medications, and counseling the patient.    Gabrielle Joiner, MD, FACOG Attending Obstetrician & Gynecologist, [facility] for [facility], [facility], [place] 07/22/2023

## 2023-07-23 LAB — GLUCOSE TOLERANCE, 2 HOURS W/ 1HR
Glucose, 1 hour: 107 mg/dL (ref 70–179)
Glucose, 2 hour: 96 mg/dL (ref 70–152)
Glucose, Fasting: 76 mg/dL (ref 70–91)

## 2023-07-24 DIAGNOSIS — O0990 Supervision of high risk pregnancy, unspecified, unspecified trimester: Secondary | ICD-10-CM | POA: Insufficient documentation

## 2023-07-24 LAB — CBC/D/PLT+RPR+RH+ABO+RUBIGG...
Antibody Screen: NEGATIVE
Basophils Absolute: 0.1 10*3/uL (ref 0.0–0.2)
Basos: 1 %
EOS (ABSOLUTE): 0.2 10*3/uL (ref 0.0–0.4)
Eos: 1 %
HCV Ab: NONREACTIVE
HIV Screen 4th Generation wRfx: NONREACTIVE
Hematocrit: 36.1 % (ref 34.0–46.6)
Hemoglobin: 11.9 g/dL (ref 11.1–15.9)
Hepatitis B Surface Ag: NEGATIVE
Immature Grans (Abs): 0 10*3/uL (ref 0.0–0.1)
Immature Granulocytes: 0 %
Lymphocytes Absolute: 3.7 10*3/uL — ABNORMAL HIGH (ref 0.7–3.1)
Lymphs: 30 %
MCH: 27.9 pg (ref 26.6–33.0)
MCHC: 33 g/dL (ref 31.5–35.7)
MCV: 85 fL (ref 79–97)
Monocytes Absolute: 1 10*3/uL — ABNORMAL HIGH (ref 0.1–0.9)
Monocytes: 8 %
Neutrophils Absolute: 7.4 10*3/uL — ABNORMAL HIGH (ref 1.4–7.0)
Neutrophils: 60 %
Platelets: 334 10*3/uL (ref 150–450)
RBC: 4.27 x10E6/uL (ref 3.77–5.28)
RDW: 12.7 % (ref 11.7–15.4)
RPR Ser Ql: NONREACTIVE
Rh Factor: POSITIVE
Rubella Antibodies, IGG: 1.41 {index} (ref >0.99)
WBC: 12.4 10*3/uL — ABNORMAL HIGH (ref 3.4–10.8)

## 2023-07-24 LAB — CYTOLOGY - PAP: Diagnosis: NEGATIVE

## 2023-07-24 LAB — CERVICOVAGINAL ANCILLARY ONLY
Chlamydia: NEGATIVE
Comment: NEGATIVE
Comment: NORMAL
Neisseria Gonorrhea: NEGATIVE

## 2023-07-24 LAB — HCV INTERPRETATION

## 2023-07-27 LAB — PANORAMA PRENATAL TEST FULL PANEL:PANORAMA TEST PLUS 5 ADDITIONAL MICRODELETIONS: FETAL FRACTION: 22.2

## 2023-07-29 ENCOUNTER — Encounter: Payer: Self-pay | Admitting: Family Medicine

## 2023-07-31 LAB — HORIZON CUSTOM: REPORT SUMMARY: NEGATIVE

## 2023-08-05 ENCOUNTER — Ambulatory Visit: Admitting: Obstetrics and Gynecology

## 2023-08-05 ENCOUNTER — Encounter: Payer: Self-pay | Admitting: Obstetrics and Gynecology

## 2023-08-05 VITALS — BP 120/77 | HR 89 | Wt 191.1 lb

## 2023-08-05 DIAGNOSIS — Z3A32 32 weeks gestation of pregnancy: Secondary | ICD-10-CM | POA: Diagnosis not present

## 2023-08-05 DIAGNOSIS — Z23 Encounter for immunization: Secondary | ICD-10-CM | POA: Diagnosis not present

## 2023-08-05 DIAGNOSIS — O0933 Supervision of pregnancy with insufficient antenatal care, third trimester: Secondary | ICD-10-CM | POA: Diagnosis not present

## 2023-08-05 DIAGNOSIS — F32A Depression, unspecified: Secondary | ICD-10-CM

## 2023-08-05 DIAGNOSIS — Z9152 Personal history of nonsuicidal self-harm: Secondary | ICD-10-CM

## 2023-08-05 DIAGNOSIS — O0930 Supervision of pregnancy with insufficient antenatal care, unspecified trimester: Secondary | ICD-10-CM | POA: Insufficient documentation

## 2023-08-05 DIAGNOSIS — O99343 Other mental disorders complicating pregnancy, third trimester: Secondary | ICD-10-CM | POA: Diagnosis not present

## 2023-08-05 DIAGNOSIS — Z3403 Encounter for supervision of normal first pregnancy, third trimester: Secondary | ICD-10-CM
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
## 2023-08-05 NOTE — Progress Notes (Signed)
 Reports back pain, vomiting, and headache that started on Friday. Back pain and vomiting has since resolved. Pt would like to discuss medication for depression that she took prior to pregnancy.

## 2023-08-05 NOTE — Patient Instructions (Addendum)
 [facility] Pediatric Providers  Please ask if the group is: Taking new patients Takes your insurance  [place]outheast [place] ([address]) [facility] Bevin Bucks, MD; Lanetta Pion, MD; [place]ville Lax, MD; Bronson Canny, MD; McDiarmid, MD; Floretta Huron, MD [address] [place] [place], [place] [address] [phone] Mon-Fri 8:30-12:30, 1:30-5:00  Providers come to see babies during newborn hospitalization Only accepting infants of Mother's who are seen at [facility] or have siblings seen at   [facility] Medicaid - Yes; Tricare - Yes   [facility] [place], MD [address], [place], [place] [address] [phone] Mon, Tue, Thur, Fri 8:30-5:00, Wed 10:00-7:00 (closed 1-2pm daily for lunch) [facility] residents with no insurance.  [place] [employer] only with Medicaid/insurance; Tricare - no  [facility]) - [facility], MD; Bevin Bucks, MD; Deeann Fare, MD; Johnathan Myron, MD; Norberta Beans, MD; Charon Copper, MD; Particia Bolus, MD; Rochelle Chu,  MD; Julietta Ogren, MD; Maebelle Schmid, MD; Silvestre Drum, MD; Stuart Ellis, MD; Arvel Lather, MD; Rina Cheek, NP [address] [place], [place], [place] [address] [phone] Mon, Tue, Thur, Fri 8:30-5:30, Wed 9:30-5:30, Sat 8:30-12:30 Only accepting infants of first-time parents or siblings of current patients Hospital discharge coordinator will make follow-up appointment Medicaid - yes; Tricare - yes  [place] [phone]) [facility] Mauro Sox, MD; Nolon Baxter, MD; Marjie Sieve, MD; Ree Candy, MD; Asencion Blacksmith, MD; [facility], MD; Marrianne Six, MD; Anell Keep, MD; Broadus Canes, MD [address], [place], [place] [address] [phone] Mon-Fri 8:30-5:00, closed for lunch 12:30-1:30; Sat-Sun 10:00-1:00 Accepting Newborns with commercial insurance only, must call prior to delivery to be accepted into  practice.  Medicaid - no, Tricare - yes   [facility] [address] [place], [place]  [address] [phone] or [phone] Mon to Fri 8am to 10pm, Sat 8am to 1pm (virtual only on weekends) Only accepts Medicaid Healthy Blue pts  [facility]) - Pediatrics at Cari Char, MD; Theodore Fisher, MD; Leonard Raker, MD; Rejeana Card, NP; Dominica Friends, MD; Skipper Dumas, MD [address] [place], [place] [address] [phone] Mon-Fri 8:30-5:30 Medicaid - yes, Tricare - yes  [place] [phone]) [facility] Billing, MD [address], [place], [place] [address] [phone] Roxana Copier, Wed Fri 8:30-5:00, Sat 8:30-12:00, Closed Thursdays Accepting siblings of established patients and first time mom's if you call prenatally Medicaid- yes; Tricare - yes  [facility] Merry [place], [place]; Barbar Bonus, MD; Genice Kettle; Scifres, PA; Paulene Boron, MD; Janifer Meigs, MD;  [address], [place], [place] [address] [phone] Mon-Fri 8:30-5:00, closed for lunch 1-2 Only accepting newborns of established patients Medicaid- no; Tricare - yes  [facility] [phone]) [facility] at Mellissa Sprinkles, MD; [address], [place], [place] [address] [phone] Mon-Fri 8:00-5:00 Medicaid - No; Tricare - Yes  [facility], [place]; [place], [place] [address] [place], [place] [address] [phone] Mon-Fri 8:00-5:00 Medicaid - No, Tricare - Yes  [facility] Freddi Jaeger, MD; Aviva Boer, MD; [place], [place] [address] [place], [place] [address] [phone]  Mon-Fri 8:00-5:00 Medicaid - No; Tricare - Yes  [facility] [address] [place], [place] [address] [phone] Mon-Fri 8:30-5:00 (lunch 12:00-1:00) Medicaid -Yes; Tricare - Yes  [facility] [place], MD [address] [place], [place], [place] [address] [phone] Mon-Fri 8:00-5:00 Seeing newborns of current patients only. No new patients Medicaid -  No, Tricare - yes  Nature conservation officer at [place] Kermit Ped, MD [address] [place], [place] [address] [phone] Mon-Fri 8:00-5:00 Medicaid -yes as secondary coverage only; Tricare - yes  [facility] [place], [place]; [place], NP; Arsenio Bigger, MD; Melanee Spire, MD; Doc Freed, MD; [place], [place]; Smoot, NP; Kimber Pencil, MD; [place], MD [address] [place], [place] [address] [phone] Mon-Fri 8:30-5:00, Sat 9:00-11:00 Accepts commercial insurance ONLY. Offers free prenatal information sessions for families. Medicaid - No, Tricare - Call first  [facility] [place], MD; [place], [place]; [place], [place]; [place], [place] [address] [place] [address] [phone] Mon-Fri 7:30-5:30 Medicaid - Yes; Duaine German yes  [place] [phone] & [phone])  [facility], MD [address], [place], [place] [address] [phone] Mon-Thur 8:00-6:00, closed for lunch 12-2, closed Fridays Medicaid - yes; Tricare - no  [facility] Alva Jewels, NP; Charmel Cooter, MD; [place], [place]; [place], [place] [address], [place], [place] [address] [phone] Mon-Fri 7:30-4:30 Medicaid - yes, Tricare - yes  [facility]  Jules Oar, MD; Arleta Bench, NP; Rhea Cecil, MD; Clarke Crouch, NP [address], [place], [place] [address] [phone] Mon-Fri 8:30-5:00, closed for lunch 1-2, Sat 8:30-12:00 - sick visits only Providers come to see babies at [facility] Only accepting newborns of siblings and first time parents ONLY if who have met with office prior to delivery Medicaid -Yes; Tricare - yes  [facility] - [place], [place]; Tamar Fairly, NP; Lajuana Pilar, MD; Rachel Budds, MD:  [address], [place], [place] [address] [phone] Mon- Fri 8:00-5:00, Sat 9:00-12:00 - sick visits only Accepting siblings of established patients and first time mom/baby Medicaid - Yes; Tricare - yes Patients must  have vaccinations (baby vaccines)  [place] [phone] & [phone])  Adult nurse HealthCare at [facility] [address] [place], [place] [address] [phone] Mon-Fri 8:00-5:00 Medicaid - no; Tricare - yes  [facility] [place], MD; [place], [place]; [place], [place] [address], [place], [place] [address] [phone] Mon-Fri 8:00-5:00 Medicaid- yes; Tricare - yes  [facility] - Sharmon Dec, MD; Rochelle Chu, NP; [place], [place] [address] [place], [place], [place] [address] [phone] Mon-Fri 8:00-5:00 Medicaid - Yes; Tricare - yes  [address] [place] [phone])  [facility] [place], [place]; [place], [place]; Encarnacion Harris, MD; Mills Alma, MD; [place], NP; Isenhour, DO; [place], [place]; Arabella Beach, MD; Kenyon Pears, MD; Valda Garnet, MD; [place], [place]; [place], [place]; [place], [place] 8527 [facility] [address], [place], [place] [address] [phone] Mon-Fri 8:30-5:00, Sat 9:00-12:00 - sick only Please register online [email or web address] then schedule online or call office Medicaid-Yes; Tricare -yes  [facility] - Premier  [place], MD; Ellwood Haber, MD; [place], MD; [place], NP; [place], [place]; Blain Bulls, MD; Micael Adas, NP; Stephenie Einstein, MD [address], [place], [place] [address] [phone] Mon-Fri 8:00-5:30, Sat&Sun by appointment (phones open at 8:30) Medicaid - Yes; Tricare - yes  [place] [phone] & [phone]) [facility] Randalyn Bushman; [place], MD; Carolin Chyle, MD; Monta Anton, NP; [place], DO [address], [place], [place] [address] [phone] M-F 8:00 - 5:15, Sat/Sun 9-12 sick visits only Medicaid - No; Tricare - yes  [facility]  Minto, PA-C; Twilight, PA-C; [place], DO; Kaylor, PA-C; Smith Dunk; Lila Regal, MD [address], [place], [place] [address] [phone] Mon-Thur 8:00-7:00, Fri 8:00-5:00 Accepting Medicaid  for 13 and under only   [facility] - Family Medicine at [facility]  [facility] [place] [place], [place]; List, FNP; Gertha Ku, MD; Gilmer Lady, PA-C; Governor Laundry, MD; Lyda Samples, FNP; [place], FNP; Gaspar Karma, MD; Gertha Ku, MD [phone] [address] [place], [place] [address] [phone] Mon-Fri 8:30-5:30 Medicaid - Yes; Tricare - yes  [facility] - [address]  [place], [place]; Johnnette Nakayama, MD; Jerryl Morin, MD; Nancyann Aye, MD; Hoyt, NP [address], [place], [place] [address] [phone] Mon-Thur 8:00-5:30, Fri 8:00-5:00, Sat 9:00-12:00 Medicaid - yes, Tricare - yes  [place] [phone])  [facility], [place]; Enrique Harvest, MD; [place], [place] [address], [place], [place] [address] [phone] Mon-Fri 8:00-5:00, closed for lunch 12-1 Medicaid - No; Tricare - yes  Nature conservation officer at [facility], MD [address] [place], [place] [address] [phone] Mon-Fri 8:00-5:00 Medicaid - No; Tricare - yes  [facility], MD; Lulu Sales, MD; Delmas Ferrari, MD; Rochelle Chu, MD [address] [facility] [address], [place], [place] [address] [phone] Mon-Fri 8:00-5:00 Medicaid- Yes; Tricare - yes  [place] [phone])  Adult nurse HealthCare at [place], [place]; [place], MD [address], [place], [place] [address] [phone] Mon-Fri 8:00-5:00 Medicaid - No; Tricare - yes  [facility] - Teddy Fear - CPNP [address], [place], [place] [address] [phone] Mon-Weds 8:00-6:00, Thurs-Fri 8:00-5:00, Sat 9:00-12:00 Medicaid - yes; Tricare - yes   [facility] Lyndal Sandy, MD; [place], [place] [address] [place], [place] [address] [phone] Mon-Fri 8:00-5:00 Medicaid - yes; Tricare - yes  [facility] Pediatric Providers  [facility] [address] [place], [place] [address] [phone] Emmette Harms: 8am -8pm, Tues, Weds: 8am - 5pm; Fri: 8-1 Medicaid - Yes; Tricare - yes  [facility] Kandis Ormond, MD; Lincoln Renshaw, MD; Karlynn Oyster, MD; [place], [place]; [place], [place] [address], [place], [place] [address] [phone] M-F 8:30 - 5:00 Medicaid - Call office; Tricare -yes  [facility] Abbott Hockey, MD; Glyn Laser, MD, Viann Graces, MD; Benedetto Brady, PNP; Abner Ables, NP [phone] [address], [place], [place] [address] [phone] M-F 8:30 - 5:00, Sat/Sun 8:30 - 12:30 (sick visits) Medicaid - Call office; Tricare -yes  [facility] Harles Lied, MD; Sylvester Evert, PNP; Tory Freiberg, MD; [place], [place]; [place], NP; Kareen Osier [address], [place], [place] [address] [phone] M-F 8:30 - 5:00 Medicaid - Call office; Tricare - yes  [facility] Orion Birks, MD; Gonzella Latin, MD; Dani Dupont, MD; Luci Russell, MD; Nogo, MD [phone] [address], [place], [place] [address] [phone] M-Thur: 8:00 - 5:00; Fri: 8:00 - 4:00 Medicaid - yes; Tricare - yes  [facility] [address] [place], [place] [address] [phone] M-F: 8:30- 5:00, closed for lunch 12:30 - 1:00 Medicaid - yes; Tricare -yes  [facility] [address] [place], [place] [address] [phone] M-F 8:00 - 5:00 Medicaid - yes; Tricare - yes  [facility] [place], DO; Theresa, DO; [place], NP [address], [place], [place] [address] [phone] M-F 8:00 - 5:00, Closed 12-1 for lunch Medicaid - Call; Tricare - yes  [facility] Vallie Gay, MD [address], [place], [place] [address] [phone] M-F: 8:00-5:00, Sat: 8:00 - noon Medicaid - call; Tricare -yes  [facility] Pediatric Providers  [facility], [place] [address] [place], [place] [address] [phone] M-W: 8:00-5:00, Thur: 8:00 - 7:00, Fri: 8:00 - noon Medicaid - yes; Tricare -  yes  [facility] - Debarah Faes, FNP [address], [place],  [place] [address] [phone] M-F 8:00 - 5:00, Closed for lunch 12-1 Medicaid - yes; Tricare - yes  [facility] Pediatric Providers  [facility] Primary Care at [place], [place], Rufina Cough, MD, [place], FNP-C [address] [facility], [address], [place], [place] [address] [phone] M-T 8:00-5:00, Wed-Fri 7:00-6:00 Medicaid - Yes; Tricare -yes  [facility] Family Medicine at [facility], [place]; [address], [address] [place], [place] [address] [phone] M-F 8:00 - 5:00, closed for lunch 12-1 Medicaid - Yes; Tricare - yes  [facility] Pediatrics and Internal Medicine  Langston Pippins, MD; Serapio Dan, MD; Jerelene Monday, MD; Gardiner Jumper, MD; Emmette Harms, MD; Dotty Gee, MD; Claire Crick, MD, Cathlene Coad, MD; Ena Harries, MD; Brian Campanile, MD; Amy Kansky, MD; Rachel Budds, MD [address] [place] [address] [place], [place] [address] [phone] M-F 8:00-5:00 Medicaid - yes; Tricare - yes  [facility] [place], MD (speaks [place] and Hindi) [address] [place], [place] [address] [phone] M-F: 8:30 - 5:00, closed 12:30 - 1 for lunch Medicaid - Yes; Tricare -yes  [facility] Pediatric Providers  [facility] Lamonte Pimenta, MD; Donia Furlough, MD; Tommas Fragmin, MD [address] [place], [place] [address] [phone] M-Th: 8:00 - 5:30, Fri: 8:00 - 12:00 Medicaid - yes; Tricare - yes  [facility] - Pediatrics at [facility], NP; Jenny Mohs, MD; Donetta Furl, MD [phone] [address] [place], [place] [address] [phone] M-F: 8:00 - 5:00 Medicaid - yes; Tricare - yes  [facility] Erhard, NP; Duwaine Gins, NP; Grant Lay, NP; Teresita Fent, MD; Broadus Canes, MD, [place], NP, Monty App, MD; Curlene Dotter [address], [place], [place] [address] [phone] M-F: 8:30 - 5:30p Medicaid - yes; Tricare - yes Other locations available as well  [facility], MD; Elvan Hamel, MD; Mardel Shad, PA-C [address], [place], [place]  [address] [phone] M-W: 8:00am - 7:00pm, Thurs: 8:00am - 8:00pm; Fri: 8:00am - 5:00pm, closed daily from 12-1 for lunch Medicaid - yes; Tricare - yes  [facility] Pediatric Providers  [facility] at Judie Noun, MD; Maida Sciara, FNP; Rise Cheng, MD; Audrie [place], MD; [place], PNP; Catana Clinch; [place], [place]; Burley Carpenter, MD;  [address] [place], [place] [address] [phone] Melven Stable - Timm Foot: 8am - 5pm, Sat 9-noon Medicaid - Yes; Tricare -yes  [facility] at Elonda Hale, MD; Rochelle Chu, FNP; Michalene Agee, MD; Delmas Ferrari, MD [address] [place], [place] [phone] M-F 8:00 - 5:00 Medicaid - call; Tricare - yes  [facility]- Lazarus Primer, MD; [place], [place]; Olevia Bers, MD; Alva Jewels, MD; Carolene Chute, MD; Real Camp, MD; [place]; Leslee Rase, MD; Arnoldo Lapping, MD; Elys[place], MD [address], [place], [place] [address] [phone] M-F 8:00am - 5:00pm; Sat. 9:00 - 11:00 Medicaid - yes; Tricare - yes  [facility] at [facility], MD [address] [place], [place] [address] [phone] M-F 8:00 - 5:00 Medicaid - [place] Medicaid only; Tricare - yes  [facility]s - Londa Rival, MD; Nolon Baxter, [place]; Selwyn Dalton, MD [address], [place], [place] [address] [phone] M-F 8:00 - 5:00 Medicaid - yes; Tricare - yes  [facility] [address] Pediatrics - Sanna Crystal, MD; Bevin Bucks, MD, [facility], MD, [place], MD; [place], MD; Felipe Horton, MD; [address] [place], [place] [address] [phone] M-F: 8-5 Medicaid - yes; Tricare - yes  [facility] - Lei Pump, [place]; Frannie, MD; [address], [place], [place] [address] [phone] M-F 8-5 Medicaid - yes;  Tricare - yes  [address] [place], MD; Lulu Sales, MD; [place], MD; [place], DNP; [place], PNP [address], [place], [place] [address] [phone] M-F 8-5 Medicaid - yes; Tricare -  yes  [facility] and Pediatrics Belia Bowl, MD; Elaine Graves; Charlotte Cookey, MD [address], [place], [place] [address] [phone] M-F 7am - 5 pm Medicaid - call; Tricare - yes  [facility] [place], [place]; Blanchard Bunk, MD; Verdia Glad, MD [address] [place], [place] [address] [phone] M-F 8-5 Medicaid - yes; Tricare - yes  [facility] Lindalou Reus, MD; Myrlene Asper, MD; Broadus Canes, FNP; Vaughn Georges, FNP; Margherita Shell, FNP; Carilyn Charles; [facility] - FNP [address], [place], [place] [address] [phone] M-F 8-5 Medicaid- yes; Tricare - yes  [facility] - Larae Plaster, Lively and Mariel Shope, MD; Remigio Carl, MD; Judee Norway, MD; Annette Barters, MD; [place], [place]; Dyann Glaser, MD; Blanchard Bunk, MD; Rodell Citrin, MD [address] [place], [place] [address] [phone] M-F: 8-5, Sat: 9-4, Sun 9-12 Medicaid - yes; Tricare - yes  [facility] Little, PNP; Nolon Baxter, PNP [address] [place], [place] [address] [phone] M-F 8 - 5, closed 12-1 for lunch Medicaid - yes; Tricare - yes  [facility] Pediatrics Sherrie Doing, MD; Tamera Falco, MD; Rodell Citrin, MD; [place], DO [address] [place], [place] [address] [phone] M-F 8- 5:30 Medicaid - yes; Tricare - yes  [facility]s - Antonieta Kitten, MD; Synetta Eves, MD; Maury Space, MD [address] [place], [place] [address] [phone] Melven Stable: Andreas Bandy; Tues-Fri: 8-5; Sat: 9-12 Medicaid - yes; Tricare - yes  [facility] Pediatrics - Erby Hatcher, MD; Serena Dana, MD; Fulton Job, MD; Bernhard Brine, MD; Herminia Lope, MD [address] [place], [place] [address] [phone] Melven StableAaron Aas Andreas BandyRosetta Cons: 8-5; Sat: 8:30-12:30 Medicaid - yes; Tricare - yes  Livia Riffle [facility]s - Thurlow [place], MD; [place],  [place] [address] Z[address] [place], [place] [address] [phone] Mon-Fri: 8-5 Medicaid - yes; Tricare - yes  [facility]s - [place], CPNP; [place], [place]; Rodell Citrin, MD; Claudio Culver, MD; Adelfa Adolph, MD; [address], [place], [place] [address] [phone] M-F: 8-5, closed 1-2 for lunch Medicaid - yes; Tricare - yes  [facility]i S[facility] Pediatrics - [facility] [place], [place]; [place], [place]; Felipe Horton, MD; [place], CPNP; [place], [place]; [place], MD; Lajuana Pilar, MD [address], [place], [place] [address] [phone] M-Thurs: Andreas Bandy; Fri: 8-6; Sat: 9-12; Sun 2-4 Medicaid - yes; Tricare - yes  [facility] Amye Kaplan, MD; Stevenson Elbe, MD; Rogelia Clarks, FNP; Lorane Rocker, DO; [address], [place], [place] [address] [phone] M-F: 8-5 Medicaid - yes; Tricare - yes  [facility] Pediatric Providers  [facility] - Family Medicine -Anselmo Bast, MD; [place], NP [address] [place], [place] [address] [phone] M - Fri: 8am - 5pm, closed for lunch 12-1 Medicaid - Yes; Tricare - yes  [facility] Gillian Lacrosse, MD; Eusebio High, MD; [place]anger, DO; Vinocur, MD;Hall, PA; Sueanne Emerald, [place]; Daina Drum, NP [phone] [address], [place], [place] [address] [phone] M-F 8:00 - 5:00, Sat 8:00 - 11:30 Medicaid - yes; Tricare - yes  [facility] Meredeth Stallion, MD; [place], MD, [address], MD, Troy, MD, [place]min[place], MD; [place], NP; [place], [place];  [address] [place], [place]  [address] [phone] M-F 8:10am - 5:00pm Medicaid - yes; Tricare - yes  [facility] Britta Candy, MD; [place], NP [address], [place], [place] [address] [phone] M-F 8:00 - 5:00 Medicaid - [place] Medicaid only; Tricare - yes  [facility] - [place] [address] [place], MD; [place], NP [address] [place],  [place], [place] [address] [phone] M-F 8:00 - 5:00; Closed for lunch 12 - 1:00 Medicaid - yes; Tricare - yes  [facility] Winfred Hausen, MD; Gerrit Krill, FNP [address] [place], [place] [address] [phone] Mon 9-5; Tues/Wed 10-5; Thurs 8:30-5; Fri: 8-12:30 Medicaid - yes; Tricare - yes  [facility] Pediatric Providers  [facility]  [place], MD; [place], [place] [address], [place], [place] [address] [phone] phone [phone] fax M-F 7:15 - 4:30 Medicaid - yes; Tricare - yes  [facility] Ena Harries, MD; [place], DO [address] [place], [place] [address] [phone] M-Fri: 8:30 - 5:00, closed for lunch everyday noon - 1pm Medicaid - Yes; Tricare - yes  [facility] Burdine, MD; Bearl Limes, MD; Gwen Lek, MD; Marykay Snipes, MD; [place], [place]; Clarisse Crosby, [place]; [place], [place]; [place], [place]; [place], [place] [address] [place], [place] [address] [phone] M-Thurs: 7:30am - 7:00pm; Friday 7:30am - 4pm; Sat: 8:00 - 1:00 Medicaid - Yes; Tricare - yes  [place] [facility] of Randalyn Bushman, MD; Arnett Lanius, MD; Trinna Furbish, MD; [place], DO [address], [place], [place] [address] [phone] M-Thur: 8:00 - 5:00, Fri: 8:00 - Noon Medicaid - yes; Tricare - yes No [place] [employer]  [facility] [facility] Dettinger, MD; Bonnell Butcher, DO; [place], NP; Gaylyn Keas, NP; Britta Candy, NP; Rosalynn Come, NP; Carlyle Childes, NP; Veleta Gerold, MD; [place], [place] [phone] [address], [place], [place] [address] [phone] M-F 8:00 - 5:00 Medicaid - yes; Tricare - yes  [facility], FNP-C; [place], FNP-C [address] [place], [place] [address] [phone] M, W, R 8:00-5:00, Tues: 8:00am - 7:00pm; Fri 8:00 - noon Medicaid - Yes; Tricare - yes  [facility], MD [address] [place], [place] [address] [phone]  M-Thurs 8:30-5:30, Fri: 8:30-12:30pm Medicaid - Yes;  Tricare - N

## 2023-08-19 ENCOUNTER — Ambulatory Visit (INDEPENDENT_AMBULATORY_CARE_PROVIDER_SITE_OTHER): Admitting: Obstetrics and Gynecology

## 2023-08-19 VITALS — BP 122/76 | HR 79 | Wt 193.0 lb

## 2023-08-19 DIAGNOSIS — Z3A34 34 weeks gestation of pregnancy: Secondary | ICD-10-CM

## 2023-08-19 DIAGNOSIS — O0930 Supervision of pregnancy with insufficient antenatal care, unspecified trimester: Secondary | ICD-10-CM

## 2023-08-19 DIAGNOSIS — O0990 Supervision of high risk pregnancy, unspecified, unspecified trimester: Secondary | ICD-10-CM | POA: Diagnosis not present

## 2023-08-19 DIAGNOSIS — F3289 Other specified depressive episodes: Secondary | ICD-10-CM

## 2023-08-19 MED ORDER — SERTRALINE HCL 50 MG PO TABS: 50.0000 mg | ORAL_TABLET | Freq: Every day | ORAL | 11 refills | Status: DC

## 2023-08-19 NOTE — Progress Notes (Signed)
   PRENATAL VISIT NOTE  Subjective:  Crystal Jennings is a 23 y.o. G1P0 at [redacted]w[redacted]d being seen today for ongoing prenatal care.  She is currently monitored for the following issues for this low-risk pregnancy and has Supervision of high risk pregnancy, antepartum; Late prenatal care; and Depression on their problem list.  Patient reports no complaints. Mood is  Contractions: Not present. Vag. Bleeding: None.  Movement: Present. Denies leaking of fluid.   The following portions of the patient's history were reviewed and updated as appropriate: allergies, current medications, past family history, past medical history, past social history, past surgical history and problem list.   Objective:   Vitals:   08/19/23 1351  BP: 122/76  Pulse: 79  Weight: 193 lb (87.5 kg)    Fetal Status: Fetal Heart Rate (bpm): 142 Fundal Height: 32 cm Movement: Present     General:  Alert, oriented and cooperative. Patient is in no acute distress.  Skin: Skin is warm and dry. No rash noted.   Cardiovascular: Normal heart rate noted  Respiratory: Normal respiratory effort, no problems with respiration noted  Abdomen: Soft, gravid, appropriate for gestational age.  Pain/Pressure: Absent      Assessment and Plan:  Pregnancy: G1P0 at [redacted]w[redacted]d 1. [redacted] weeks gestation of pregnancy 2. Supervision of high risk pregnancy, antepartum (Primary) Anatomy scheduled 6/9 Discussed GBS/GC/CT next visit Pediatrician - [facility] MOC discussed. Considering permanent sterilization. Discussed LARC. Bedsider info given Discussed si/sx preterm labor Discussed folic acid is in her prenatal vitamin, separate pill is not needed  3. Late prenatal care 4. Other depression Restarted sertraline last week and feeling better- will send updated rx Discussed neonatal adaptation syndrome Will route message to [facility]  Please refer to After Visit Summary for other counseling recommendations.   No follow-ups on  file.  Future Appointments  Date Time Provider Department Center  08/26/2023  7:00 AM [facility] PROVIDER 1 [facility]  08/26/2023  7:30 AM [facility] US4 [facility]  09/02/2023  1:30 PM Gabrielle Joiner, MD [facility] None    Izell Marsh, MD

## 2023-08-19 NOTE — Progress Notes (Signed)
 Asking for folic acid pills. States mother told her to take them. Vomited last week but none now.

## 2023-08-20 ENCOUNTER — Telehealth: Payer: Self-pay | Admitting: Licensed Clinical Social Worker

## 2023-08-20 NOTE — Telephone Encounter (Signed)
 [facility] contacted patient on this date to provide [facility] intro and to schedule [facility] appointment. [facility] left a VM.

## 2023-08-26 ENCOUNTER — Other Ambulatory Visit: Payer: Self-pay | Admitting: Obstetrics

## 2023-08-26 ENCOUNTER — Ambulatory Visit: Admitting: *Deleted

## 2023-08-26 ENCOUNTER — Ambulatory Visit: Attending: Obstetrics

## 2023-08-26 ENCOUNTER — Other Ambulatory Visit: Payer: Self-pay | Admitting: *Deleted

## 2023-08-26 ENCOUNTER — Ambulatory Visit: Admitting: Obstetrics and Gynecology

## 2023-08-26 VITALS — BP 98/63 | HR 83

## 2023-08-26 DIAGNOSIS — Z3A33 33 weeks gestation of pregnancy: Secondary | ICD-10-CM | POA: Insufficient documentation

## 2023-08-26 DIAGNOSIS — Z3689 Encounter for other specified antenatal screening: Secondary | ICD-10-CM

## 2023-08-26 DIAGNOSIS — Z3A35 35 weeks gestation of pregnancy: Secondary | ICD-10-CM

## 2023-08-26 DIAGNOSIS — O99213 Obesity complicating pregnancy, third trimester: Secondary | ICD-10-CM

## 2023-08-26 DIAGNOSIS — O283 Abnormal ultrasonic finding on antenatal screening of mother: Secondary | ICD-10-CM | POA: Diagnosis present

## 2023-08-26 DIAGNOSIS — O0990 Supervision of high risk pregnancy, unspecified, unspecified trimester: Secondary | ICD-10-CM

## 2023-08-26 DIAGNOSIS — E669 Obesity, unspecified: Secondary | ICD-10-CM | POA: Diagnosis not present

## 2023-08-26 DIAGNOSIS — O0930 Supervision of pregnancy with insufficient antenatal care, unspecified trimester: Secondary | ICD-10-CM

## 2023-08-26 DIAGNOSIS — O0933 Supervision of pregnancy with insufficient antenatal care, third trimester: Secondary | ICD-10-CM | POA: Diagnosis not present

## 2023-08-26 NOTE — Procedures (Addendum)
 Crystal Jennings 03/26/2000 [redacted]w[redacted]d  Fetus A Non-Stress Test Interpretation for 08/26/23   Indication: Failed BPP  Fetal Heart Rate A Mode: External Baseline Rate (A): 140 bpm Variability: Moderate Accelerations: 15 x 15 Decelerations: None Multiple birth?: No  Uterine Activity Mode: Palpation, Toco Contraction Frequency (min): Irreg Contraction Duration (sec): 20-40 Contraction Quality: Mild Resting Tone Palpated: Relaxed Resting Time: Adequate  Interpretation (Fetal Testing) Nonstress Test Interpretation: Reactive Comments: Dr. Arnie Bibber reviewed tracing.

## 2023-08-26 NOTE — Progress Notes (Signed)
 Maternal-Fetal Medicine Consultation Name: Crystal Jennings MRN: [id number]  Patient initiated her prenatal care last month.  She is unsure of her LMP date.  She has not had ultrasound in this pregnancy. On cell-free fetal DNA screening, the risks of fetal aneuploidies are not increased.  She does not have gestational diabetes. Blood pressure today at our office is 98/63 mmHg.  Ultrasound Fetal biometry is consistent with 33 weeks and 4 days gestation.  Amniotic fluid is normal good fetal activity seen.  Fetal anatomical survey appears normal but limited by advanced gestational age. Umbilical artery Dopplers study, performed because of suboptimal dating and suspected fetal growth restriction, showed normal forward diastolic flow.  Incidentally observed BPP showed fetal breathing movements did not meet the criteria.  NST is reactive.  BPP 8/10.  We have assigned her EDD based on today's ultrasound measurements at 10/10/2023.  Suboptimally dated pregnancy I counseled the patient that we have assigned her EDD based on today's ultrasound, which is suboptimal.  Third trimester pregnancy dating carries a margin of error up to 15%. In the absence of other risk factors, weekly antenatal testing is not necessary. I reassured the patient of normal screening results.   Recommendations - An appointment was made for her to return in 4 weeks for fetal growth assessment.  Consultation including face-to-face (more than 50%) counseling 15 minutes.

## 2023-09-02 ENCOUNTER — Ambulatory Visit: Admitting: Obstetrics

## 2023-09-02 ENCOUNTER — Encounter: Payer: Self-pay | Admitting: Obstetrics

## 2023-09-02 VITALS — BP 114/79 | HR 96 | Wt 193.0 lb

## 2023-09-02 DIAGNOSIS — O0933 Supervision of pregnancy with insufficient antenatal care, third trimester: Secondary | ICD-10-CM

## 2023-09-02 DIAGNOSIS — Z3A34 34 weeks gestation of pregnancy: Secondary | ICD-10-CM | POA: Diagnosis not present

## 2023-09-02 DIAGNOSIS — O09513 Supervision of elderly primigravida, third trimester: Secondary | ICD-10-CM | POA: Diagnosis not present

## 2023-09-02 DIAGNOSIS — O0990 Supervision of high risk pregnancy, unspecified, unspecified trimester: Secondary | ICD-10-CM

## 2023-09-02 DIAGNOSIS — O0930 Supervision of pregnancy with insufficient antenatal care, unspecified trimester: Secondary | ICD-10-CM

## 2023-09-02 NOTE — Progress Notes (Signed)
 Subjective:  Crystal Jennings is a 23 y.o. G1P0 at [redacted]w[redacted]d being seen today for ongoing prenatal care.  She is currently monitored for the following issues for this high-risk pregnancy and has Supervision of high risk pregnancy, antepartum; Late prenatal care; and Depression on their problem list.  Patient reports no complaints.  Contractions: Irregular. Vag. Bleeding: None.  Movement: Present. Denies leaking of fluid.   The following portions of the patient's history were reviewed and updated as appropriate: allergies, current medications, past family history, past medical history, past social history, past surgical history and problem list. Problem list updated.  Objective:   Vitals:   09/02/23 1323  BP: 114/79  Pulse: 96  Weight: 193 lb (87.5 kg)    Fetal Status: Fetal Heart Rate (bpm): 156   Movement: Present     General:  Alert, oriented and cooperative. Patient is in no acute distress.  Skin: Skin is warm and dry. No rash noted.   Cardiovascular: Normal heart rate noted  Respiratory: Normal respiratory effort, no problems with respiration noted  Abdomen: Soft, gravid, appropriate for gestational age. Pain/Pressure: Absent     Pelvic:  Cervical exam deferred        Extremities: Normal range of motion.  Edema: None  Mental Status: Normal mood and affect. Normal behavior. Normal judgment and thought content.   Urinalysis:      Assessment and Plan:  Pregnancy: G1P0 at [redacted]w[redacted]d  1. Supervision of high risk pregnancy, antepartum (Primary)  2. Primigravida of advanced maternal age in third trimester  3. Late prenatal care   Preterm labor symptoms and general obstetric precautions including but not limited to vaginal bleeding, contractions, leaking of fluid and fetal movement were reviewed in detail with the patient. Please refer to After Visit Summary for other counseling recommendations.   Return in about 2 weeks (around 09/16/2023) for [facility].   Gabrielle Joiner,  MD 09/02/2023

## 2023-09-02 NOTE — Progress Notes (Signed)
 C/O heartburn. Gets nauseated, then throws up. Has some pain on both abdominal sides.

## 2023-09-16 ENCOUNTER — Other Ambulatory Visit (HOSPITAL_COMMUNITY)
Admission: RE | Admit: 2023-09-16 | Discharge: 2023-09-16 | Disposition: A | Discharge status: Home / Self Care | Source: Ambulatory Visit | Attending: Obstetrics and Gynecology | Admitting: Obstetrics and Gynecology

## 2023-09-16 ENCOUNTER — Ambulatory Visit: Admitting: Obstetrics and Gynecology

## 2023-09-16 VITALS — BP 101/68 | HR 76 | Wt 196.0 lb

## 2023-09-16 DIAGNOSIS — O0990 Supervision of high risk pregnancy, unspecified, unspecified trimester: Secondary | ICD-10-CM | POA: Diagnosis present

## 2023-09-16 DIAGNOSIS — O99343 Other mental disorders complicating pregnancy, third trimester: Secondary | ICD-10-CM | POA: Diagnosis not present

## 2023-09-16 DIAGNOSIS — Z3A36 36 weeks gestation of pregnancy: Secondary | ICD-10-CM

## 2023-09-16 DIAGNOSIS — O0933 Supervision of pregnancy with insufficient antenatal care, third trimester: Secondary | ICD-10-CM | POA: Diagnosis not present

## 2023-09-16 DIAGNOSIS — O0930 Supervision of pregnancy with insufficient antenatal care, unspecified trimester: Secondary | ICD-10-CM

## 2023-09-16 DIAGNOSIS — Z1331 Encounter for screening for depression: Secondary | ICD-10-CM | POA: Diagnosis not present

## 2023-09-16 DIAGNOSIS — F329 Major depressive disorder, single episode, unspecified: Secondary | ICD-10-CM

## 2023-09-16 MED ORDER — FAMOTIDINE 20 MG PO TABS: 20.0000 mg | ORAL_TABLET | Freq: Two times a day (BID) | ORAL | 3 refills | Status: DC

## 2023-09-16 NOTE — Progress Notes (Signed)
ROB GBS 
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
## 2023-09-17 LAB — CERVICOVAGINAL ANCILLARY ONLY
Chlamydia: NEGATIVE
Comment: NEGATIVE
Comment: NORMAL
Neisseria Gonorrhea: NEGATIVE

## 2023-09-18 ENCOUNTER — Ambulatory Visit: Payer: Self-pay | Admitting: Obstetrics and Gynecology

## 2023-09-18 DIAGNOSIS — O0990 Supervision of high risk pregnancy, unspecified, unspecified trimester: Secondary | ICD-10-CM

## 2023-09-19 LAB — CULTURE, BETA STREP (GROUP B ONLY): Strep Gp B Culture: POSITIVE — AB

## 2023-09-24 ENCOUNTER — Ambulatory Visit

## 2023-09-24 ENCOUNTER — Ambulatory Visit: Admitting: Obstetrics & Gynecology

## 2023-09-24 VITALS — BP 120/82 | HR 70 | Wt 197.0 lb

## 2023-09-24 DIAGNOSIS — O0993 Supervision of high risk pregnancy, unspecified, third trimester: Secondary | ICD-10-CM

## 2023-09-24 DIAGNOSIS — O0930 Supervision of pregnancy with insufficient antenatal care, unspecified trimester: Secondary | ICD-10-CM

## 2023-09-24 DIAGNOSIS — Z3A37 37 weeks gestation of pregnancy: Secondary | ICD-10-CM | POA: Diagnosis not present

## 2023-09-24 DIAGNOSIS — O0990 Supervision of high risk pregnancy, unspecified, unspecified trimester: Secondary | ICD-10-CM | POA: Diagnosis not present

## 2023-09-24 NOTE — Progress Notes (Signed)
   PRENATAL VISIT NOTE  Subjective:  Crystal Jennings is a 23 y.o. G1P0 at [redacted]w[redacted]d being seen today for ongoing prenatal care.  She is currently monitored for the following issues for this low-risk pregnancy and has Supervision of high risk pregnancy, antepartum; Late prenatal care; and Depression on their problem list.  Patient reports occasional contractions.  Contractions: Irritability. Vag. Bleeding: None.  Movement: Present. Denies leaking of fluid.   The following portions of the patient's history were reviewed and updated as appropriate: allergies, current medications, past family history, past medical history, past social history, past surgical history and problem list.   Objective:    Vitals:   09/24/23 1340  BP: 120/82  Pulse: 70  Weight: 197 lb (89.4 kg)    Fetal Status:  Fetal Heart Rate (bpm): 135   Movement: Present    General: Alert, oriented and cooperative. Patient is in no acute distress.  Skin: Skin is warm and dry. No rash noted.   Cardiovascular: Normal heart rate noted  Respiratory: Normal respiratory effort, no problems with respiration noted  Abdomen: Soft, gravid, appropriate for gestational age.  Pain/Pressure: Present     Pelvic: Cervical exam deferred        Extremities: Normal range of motion.  Edema: None  Mental Status: Normal mood and affect. Normal behavior. Normal judgment and thought content.   Assessment and Plan:  Pregnancy: G1P0 at [redacted]w[redacted]d 1. Supervision of high risk pregnancy, antepartum (Primary) Dating by late trimester US   2. Late prenatal care   Term labor symptoms and general obstetric precautions including but not limited to vaginal bleeding, contractions, leaking of fluid and fetal movement were reviewed in detail with the patient. Please refer to After Visit Summary for other counseling recommendations.   Return in about 1 week (around 10/01/2023).  Future Appointments  Date Time Provider Department Center  09/25/2023  10:00 AM [facility] PROVIDER 1 [facility]  09/25/2023 10:30 AM [facility] US7 [facility]  10/02/2023 11:15 AM Zina Jerilynn LABOR, MD [facility] None    Lynwood Solomons, MD

## 2023-09-25 ENCOUNTER — Ambulatory Visit

## 2023-09-25 ENCOUNTER — Ambulatory Visit: Attending: Obstetrics and Gynecology | Admitting: Obstetrics

## 2023-09-25 VITALS — BP 124/76 | HR 77

## 2023-09-25 DIAGNOSIS — E669 Obesity, unspecified: Secondary | ICD-10-CM

## 2023-09-25 DIAGNOSIS — O0933 Supervision of pregnancy with insufficient antenatal care, third trimester: Secondary | ICD-10-CM

## 2023-09-25 DIAGNOSIS — O99213 Obesity complicating pregnancy, third trimester: Secondary | ICD-10-CM

## 2023-09-25 DIAGNOSIS — Z3A37 37 weeks gestation of pregnancy: Secondary | ICD-10-CM | POA: Insufficient documentation

## 2023-09-25 DIAGNOSIS — Z3687 Encounter for antenatal screening for uncertain dates: Secondary | ICD-10-CM | POA: Diagnosis not present

## 2023-09-25 DIAGNOSIS — O0930 Supervision of pregnancy with insufficient antenatal care, unspecified trimester: Secondary | ICD-10-CM

## 2023-09-25 NOTE — Progress Notes (Signed)
 MFM Consult Note  Crystal Jennings is currently at 37 weeks and 6 days.  She has been followed as she presented late for prenatal care and due to maternal obesity with a BMI of 35.  She denies any problems since her last exam.  On today's exam, the overall EFW of 7 pounds measures at the 47th percentile for her gestational age.    There was normal amniotic fluid noted with a total AFI of 15.68 cm.    The fetus was in the vertex presentation.    Labor precautions were reviewed today.    No further exams were scheduled in our office.    The patient stated that all of her questions were answered today.  A total of 10 minutes was spent counseling and coordinating the care for this patient.  Greater than 50% of the time was spent in direct face-to-face contact.

## 2023-09-29 ENCOUNTER — Other Ambulatory Visit: Payer: Self-pay

## 2023-09-29 ENCOUNTER — Inpatient Hospital Stay (EMERGENCY_DEPARTMENT_HOSPITAL)
Admission: AD | Admit: 2023-09-29 | Discharge: 2023-09-30 | Disposition: A | Discharge status: Home / Self Care | Source: Home / Self Care | Attending: Obstetrics and Gynecology | Admitting: Obstetrics and Gynecology

## 2023-09-29 ENCOUNTER — Encounter (HOSPITAL_COMMUNITY): Payer: Self-pay | Admitting: Obstetrics and Gynecology

## 2023-09-29 DIAGNOSIS — O368130 Decreased fetal movements, third trimester, not applicable or unspecified: Secondary | ICD-10-CM | POA: Insufficient documentation

## 2023-09-29 DIAGNOSIS — F32A Depression, unspecified: Secondary | ICD-10-CM | POA: Diagnosis present

## 2023-09-29 DIAGNOSIS — Z3A38 38 weeks gestation of pregnancy: Secondary | ICD-10-CM | POA: Insufficient documentation

## 2023-09-29 DIAGNOSIS — O0930 Supervision of pregnancy with insufficient antenatal care, unspecified trimester: Secondary | ICD-10-CM

## 2023-09-29 DIAGNOSIS — O0933 Supervision of pregnancy with insufficient antenatal care, third trimester: Secondary | ICD-10-CM | POA: Insufficient documentation

## 2023-09-29 DIAGNOSIS — O26893 Other specified pregnancy related conditions, third trimester: Secondary | ICD-10-CM | POA: Insufficient documentation

## 2023-09-29 DIAGNOSIS — O479 False labor, unspecified: Secondary | ICD-10-CM

## 2023-09-29 DIAGNOSIS — B9689 Other specified bacterial agents as the cause of diseases classified elsewhere: Secondary | ICD-10-CM

## 2023-09-29 DIAGNOSIS — N898 Other specified noninflammatory disorders of vagina: Secondary | ICD-10-CM | POA: Insufficient documentation

## 2023-09-29 LAB — WET PREP, GENITAL
Sperm: NONE SEEN
Trich, Wet Prep: NONE SEEN
WBC, Wet Prep HPF POC: >=10 — AB (ref <10)
Yeast Wet Prep HPF POC: NONE SEEN

## 2023-09-29 LAB — POCT FERN TEST: POCT Fern Test: NEGATIVE

## 2023-09-29 MED ORDER — OXYCODONE-ACETAMINOPHEN 5-325 MG PO TABS
2.0000 | ORAL_TABLET | Freq: Once | ORAL | Status: AC
  Administered 2023-09-30: 2 via ORAL
  Filled 2023-09-29: qty 2

## 2023-09-29 NOTE — MAU Note (Signed)
 Crystal Jennings is a 23 y.o. at [redacted]w[redacted]d here in MAU reporting: DFM since yesterday morning and has felt no movement from baby at all. CTXs/cramping started yesterday as well as vaginal discharge. Vaginal discharge that is watery/mucus that is red in color. Ctxs feels like they are every 5 mins. Pelvic pressure has increased.    Onset of complaint: yesterday  Pain score: 9 Vitals:   09/29/23 1838  BP: 113/79  Pulse: 85  Resp: 16  Temp: (!) 97.5 F (36.4 C)  SpO2: 98%     FHT:155 Lab orders placed from triage:  Labor eval

## 2023-09-29 NOTE — MAU Provider Note (Cosign Needed Addendum)
 History     CSN: [id number]  Arrival date and time: 09/29/23 1825   Event Date/Time   First Provider Initiated Contact with Patient 09/29/23 1915      Chief Complaint  Patient presents with   Decreased Fetal Movement   Contractions   HPI  Crystal Jennings is a 23 y.o. G1P0 female at [redacted]w[redacted]d weeks gestation who presents to MAU reporting DFM w/ cramping and vaginal bleeding.  Pregnancy is complicated by late to care.    The patient and her mother were in the room. The patient reports that [place] she [place] fle[place] her [place] she felt sudden pelvic cramping and soon after went to the bathroom and noticed some streaks of blood in her vaginal discharge as well as a few clots. She denied any leakage of fluid. Since this incident yesterday, she has only felt her baby move one time. She has continued to have intermittent pelvic pain that occasionally radiates to her back. Since yesterday she also feels like she has been unable to fully empty her bladder. She denies abnormal discharge or burning with urination or abnormal bowel movements. She also denies any vaginal bleeding since the incident yesterday.     OB History     Gravida  1   Para      Term      Preterm      AB      Living         SAB      IAB      Ectopic      Multiple      Live Births              Past Medical History:  Diagnosis Date   Asthma    Hearing impairment     Past Surgical History:  Procedure Laterality Date   NO PAST SURGERIES      Family History  Problem Relation Age of Onset   Diabetes Neg Hx    Hypertension Neg Hx     Social History   Tobacco Use   Smoking status: Never   Smokeless tobacco: Never  Vaping Use   Vaping status: Never Used  Substance Use Topics   Alcohol use: Never   Drug use: Never    Allergies: No Known Allergies  Medications Prior to Admission  Medication Sig Dispense Refill Last Dose/Taking   famotidine  (PEPCID ) 20 MG  tablet Take 1 tablet (20 mg total) by mouth 2 (two) times daily. 60 tablet 3 09/29/2023   Prenatal Vit-Fe Fumarate-FA (PREPLUS) 27-1 MG TABS Take 1 tablet by mouth daily. 30 tablet 13 09/29/2023   sertraline  (ZOLOFT ) 50 MG tablet Take 1 tablet (50 mg total) by mouth daily. 30 tablet 11 09/29/2023   cetirizine  (ZYRTEC ) 10 MG tablet Take 1 tablet (10 mg total) by mouth daily. (Patient not taking: Reported on 09/02/2023) 30 tablet 11    Results for orders placed or performed during the hospital encounter of 09/29/23 (from the past 48 hours)  POCT fern test     Status: Normal   Collection Time: 09/29/23  7:13 PM  Result Value Ref Range   POCT Fern Test Negative = intact amniotic membranes   Wet prep, genital     Status: Abnormal   Collection Time: 09/29/23  7:56 PM   Specimen: Vaginal  Result Value Ref Range   Yeast Wet Prep HPF POC NONE SEEN NONE SEEN   Trich, Wet Prep NONE SEEN NONE SEEN  Clue Cells Wet Prep HPF POC PRESENT (A) NONE SEEN   WBC, Wet Prep HPF POC >=10 (A) <10   Sperm NONE SEEN     Comment: Performed at [facility], [address], [place], [place] [address]    Review of Systems  Constitutional:  Negative for fever.  Genitourinary:  Positive for vaginal discharge (bloody show).   Physical Exam   Blood pressure 113/79, pulse 85, temperature (!) 97.5 F (36.4 C), temperature source Oral, resp. rate 16, height 5' 2 (1.575 m), weight 90.5 kg, last menstrual period 12/18/2022, SpO2 98%.  Physical Exam Constitutional:      General: She is not in acute distress.    Appearance: Normal appearance. She is not ill-appearing, toxic-appearing or diaphoretic.  Genitourinary:    Comments: Dilation: 2.5 Effacement (%): 100 Cervical Position: Middle Station: -2 Presentation: Vertex Exam by:: weston,rn  Skin:    General: Skin is warm.  Neurological:     Mental Status: She is alert and oriented to person, place, and time.    Category 1 fetal tracing.  MAU Course   Procedures  MDM  Initial exam 2 cm, 100% -2. Patient uncomfortable so she was checked 45 minutes later Dilation: 2.5 Effacement (%): 100 Cervical Position: Middle Station: -2 Presentation: Vertex Exam by:: weston,rn   Report given to labor team Dr. Loyola and Dr. Von. Will monitor and recheck pain in 1 hour.  She is likely in early labor. + GBS  Brecken Dewoody, Delon FERNS, NP 09/29/2023 8:42 PM   Assessment and Plan  Minimal cervical change after 5 hours Dilation: 3 Effacement (%): 100 Cervical Position: Middle Station: -2 Presentation: Vertex Exam by:: [employer], RN  Desires something for pain, will give Oxycodone -acetaminophen  10 mg/650 mg once  Treat BV with Flagyl  500mg  BID x 7 days, sent to preferred pharmacy.  Discharge home with strict return precautions.  Continue with scheduled routine prenatal care.   Mardy Loyola, MD FMOB Fellow, Faculty practice [facility], Center for [facility]

## 2023-09-29 NOTE — MAU Provider Note (Incomplete)
 History     CSN: [id number]  Arrival date and time: 09/29/23 1825   Event Date/Time   First Provider Initiated Contact with Patient 09/29/23 1915      Chief Complaint  Patient presents with   Decreased Fetal Movement   Contractions   HPI  Crystal Jennings is a 23 y.o. year old G1P0 female at [redacted]w[redacted]d weeks gestation who presents to MAU reporting DFM w/ cramping and vaginal bleeding.  The patient and her mother were in the room. The patient reports that while she was at the flea market with her mother she felt sudden pelvic cramping and soon after went to the bathroom and noticed some streaks of blood in her vaginal discharge as well as a few clots. She denied any leakage of fluid. Since this incident yesterday, she has only felt her baby move one time. She has continued to have intermittent pelvic pain that occasionally radiates to her back. Since yesterday she also feels like she has been unable to fully empty her bladder. She denies abnormal discharge or burning with urination or abnormal bowel movements. She also denies any vaginal bleeding since the incident yesterday.     OB History     Gravida  1   Para      Term      Preterm      AB      Living         SAB      IAB      Ectopic      Multiple      Live Births              Past Medical History:  Diagnosis Date   Asthma    Hearing impairment     Past Surgical History:  Procedure Laterality Date   NO PAST SURGERIES      Family History  Problem Relation Age of Onset   Diabetes Neg Hx    Hypertension Neg Hx     Social History   Tobacco Use   Smoking status: Never   Smokeless tobacco: Never  Vaping Use   Vaping status: Never Used  Substance Use Topics   Alcohol use: Never   Drug use: Never    Allergies: No Known Allergies  Medications Prior to Admission  Medication Sig Dispense Refill Last Dose/Taking   famotidine  (PEPCID ) 20 MG tablet Take 1 tablet (20 mg total) by  mouth 2 (two) times daily. 60 tablet 3 09/29/2023   Prenatal Vit-Fe Fumarate-FA (PREPLUS) 27-1 MG TABS Take 1 tablet by mouth daily. 30 tablet 13 09/29/2023   sertraline  (ZOLOFT ) 50 MG tablet Take 1 tablet (50 mg total) by mouth daily. 30 tablet 11 09/29/2023   cetirizine  (ZYRTEC ) 10 MG tablet Take 1 tablet (10 mg total) by mouth daily. (Patient not taking: Reported on 09/02/2023) 30 tablet 11     Review of Systems  Respiratory: Negative.    Cardiovascular: Negative.   Gastrointestinal: Negative.   Genitourinary: Negative.    Physical Exam   Blood pressure 113/79, pulse 85, temperature (!) 97.5 F (36.4 C), temperature source Oral, resp. rate 16, height 5' 2 (1.575 m), weight 90.5 kg, last menstrual period 12/18/2022, SpO2 98%.  FHT: 155  Physical Exam  MAU Course  Procedures  MDM POCT fern test  Wet prep genital GC/Chlamydia   Assessment and Plan   DFM in pregnancy   2.    This is a Psychologist, occupational Note.     LOS:  0 days    Damaso Laday J, Medical Student
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
## 2023-09-30 ENCOUNTER — Encounter (HOSPITAL_COMMUNITY): Payer: Self-pay | Admitting: Obstetrics and Gynecology

## 2023-09-30 ENCOUNTER — Inpatient Hospital Stay (HOSPITAL_COMMUNITY)
Admission: AD | Admit: 2023-09-30 | Discharge: 2023-10-01 | DRG: 807 | Disposition: A | Discharge status: Home / Self Care | Attending: Family Medicine | Admitting: Family Medicine

## 2023-09-30 ENCOUNTER — Inpatient Hospital Stay (HOSPITAL_COMMUNITY): Admitting: Anesthesiology

## 2023-09-30 DIAGNOSIS — O99820 Streptococcus B carrier state complicating pregnancy: Secondary | ICD-10-CM | POA: Diagnosis not present

## 2023-09-30 DIAGNOSIS — O99344 Other mental disorders complicating childbirth: Secondary | ICD-10-CM | POA: Diagnosis present

## 2023-09-30 DIAGNOSIS — O479 False labor, unspecified: Secondary | ICD-10-CM

## 2023-09-30 DIAGNOSIS — Z79899 Other long term (current) drug therapy: Secondary | ICD-10-CM

## 2023-09-30 DIAGNOSIS — O0933 Supervision of pregnancy with insufficient antenatal care, third trimester: Secondary | ICD-10-CM | POA: Diagnosis not present

## 2023-09-30 DIAGNOSIS — Z3A38 38 weeks gestation of pregnancy: Secondary | ICD-10-CM | POA: Diagnosis not present

## 2023-09-30 DIAGNOSIS — Z30017 Encounter for initial prescription of implantable subdermal contraceptive: Secondary | ICD-10-CM | POA: Diagnosis not present

## 2023-09-30 DIAGNOSIS — O26893 Other specified pregnancy related conditions, third trimester: Secondary | ICD-10-CM | POA: Diagnosis present

## 2023-09-30 DIAGNOSIS — O99824 Streptococcus B carrier state complicating childbirth: Principal | ICD-10-CM | POA: Diagnosis present

## 2023-09-30 DIAGNOSIS — F329 Major depressive disorder, single episode, unspecified: Secondary | ICD-10-CM | POA: Diagnosis present

## 2023-09-30 LAB — CBC
HCT: 40.2 % (ref 36.0–46.0)
Hemoglobin: 13.7 g/dL (ref 12.0–15.0)
MCH: 27.6 pg (ref 26.0–34.0)
MCHC: 34.1 g/dL (ref 30.0–36.0)
MCV: 80.9 fL (ref 80.0–100.0)
Platelets: 286 K/uL (ref 150–400)
RBC: 4.97 MIL/uL (ref 3.87–5.11)
RDW: 14.8 % (ref 11.5–15.5)
WBC: 18.1 K/uL — ABNORMAL HIGH (ref 4.0–10.5)
nRBC: 0 % (ref 0.0–0.2)

## 2023-09-30 LAB — COMPREHENSIVE METABOLIC PANEL WITH GFR
ALT: 13 U/L (ref 0–44)
AST: 22 U/L (ref 15–41)
Albumin: 3.1 g/dL — ABNORMAL LOW (ref 3.5–5.0)
Alkaline Phosphatase: 131 U/L — ABNORMAL HIGH (ref 38–126)
Anion gap: 14 (ref 5–15)
BUN: <5 mg/dL — ABNORMAL LOW (ref 6–20)
CO2: 17 mmol/L — ABNORMAL LOW (ref 22–32)
Calcium: 9.1 mg/dL (ref 8.9–10.3)
Chloride: 102 mmol/L (ref 98–111)
Creatinine, Ser: 0.64 mg/dL (ref 0.44–1.00)
GFR, Estimated: >60 mL/min (ref >60)
Glucose, Bld: 103 mg/dL — ABNORMAL HIGH (ref 70–99)
Potassium: 3.7 mmol/L (ref 3.5–5.1)
Sodium: 133 mmol/L — ABNORMAL LOW (ref 135–145)
Total Bilirubin: 0.8 mg/dL (ref 0.0–1.2)
Total Protein: 7.4 g/dL (ref 6.5–8.1)

## 2023-09-30 LAB — GC/CHLAMYDIA PROBE AMP (~~LOC~~) NOT AT ARMC
Chlamydia: NEGATIVE
Comment: NEGATIVE
Comment: NORMAL
Neisseria Gonorrhea: NEGATIVE

## 2023-09-30 LAB — TYPE AND SCREEN
ABO/RH(D): O POS
Antibody Screen: NEGATIVE

## 2023-09-30 LAB — RPR: RPR Ser Ql: NONREACTIVE

## 2023-09-30 MED ORDER — METRONIDAZOLE 500 MG PO TABS
500.0000 mg | ORAL_TABLET | Freq: Two times a day (BID) | ORAL | Status: DC
  Administered 2023-09-30 – 2023-10-01 (×3): 500 mg via ORAL
  Filled 2023-09-30 (×4): qty 1

## 2023-09-30 MED ORDER — PENICILLIN G POT IN DEXTROSE 60000 UNIT/ML IV SOLN
3.0000 10*6.[IU] | INTRAVENOUS | Status: DC
  Administered 2023-09-30: 3 10*6.[IU] via INTRAVENOUS
  Filled 2023-09-30 (×4): qty 50

## 2023-09-30 MED ORDER — OXYCODONE-ACETAMINOPHEN 5-325 MG PO TABS: 1.0000 | ORAL_TABLET | ORAL | Status: DC | PRN

## 2023-09-30 MED ORDER — OXYCODONE HCL 5 MG PO TABS: 10.0000 mg | ORAL_TABLET | ORAL | Status: DC | PRN

## 2023-09-30 MED ORDER — FENTANYL-BUPIVACAINE-NACL 0.5-0.125-0.9 MG/250ML-% EP SOLN
12.0000 mL/h | EPIDURAL | Status: DC | PRN
  Administered 2023-09-30: 12 mL/h via EPIDURAL
  Filled 2023-09-30: qty 250

## 2023-09-30 MED ORDER — DIPHENHYDRAMINE HCL 25 MG PO CAPS: 25.0000 mg | ORAL_CAPSULE | Freq: Four times a day (QID) | ORAL | Status: DC | PRN

## 2023-09-30 MED ORDER — LACTATED RINGERS IV SOLN
500.0000 mL | Freq: Once | INTRAVENOUS | Status: AC
  Administered 2023-09-30: 500 mL via INTRAVENOUS

## 2023-09-30 MED ORDER — ONDANSETRON HCL 4 MG PO TABS: 4.0000 mg | ORAL_TABLET | ORAL | Status: DC | PRN

## 2023-09-30 MED ORDER — SENNOSIDES-DOCUSATE SODIUM 8.6-50 MG PO TABS
2.0000 | ORAL_TABLET | Freq: Every day | ORAL | Status: DC
  Administered 2023-10-01: 2 via ORAL
  Filled 2023-09-30: qty 2

## 2023-09-30 MED ORDER — LIDOCAINE HCL (PF) 1 % IJ SOLN
30.0000 mL | INTRAMUSCULAR | Status: AC | PRN
  Administered 2023-09-30: 30 mL via SUBCUTANEOUS
  Filled 2023-09-30: qty 30

## 2023-09-30 MED ORDER — HYDROXYZINE HCL 25 MG PO TABS: 50.0000 mg | ORAL_TABLET | Freq: Four times a day (QID) | ORAL | Status: DC | PRN

## 2023-09-30 MED ORDER — TETANUS-DIPHTH-ACELL PERTUSSIS 5-2.5-18.5 LF-MCG/0.5 IM SUSY: 0.5000 mL | PREFILLED_SYRINGE | Freq: Once | INTRAMUSCULAR | Status: DC

## 2023-09-30 MED ORDER — ONDANSETRON HCL 4 MG/2ML IJ SOLN: 4.0000 mg | INTRAMUSCULAR | Status: DC | PRN

## 2023-09-30 MED ORDER — ACETAMINOPHEN 325 MG PO TABS: 650.0000 mg | ORAL_TABLET | ORAL | Status: DC | PRN

## 2023-09-30 MED ORDER — MEDROXYPROGESTERONE ACETATE 150 MG/ML IM SUSP: 150.0000 mg | INTRAMUSCULAR | Status: DC | PRN

## 2023-09-30 MED ORDER — DIBUCAINE (PERIANAL) 1 % EX OINT: 1.0000 | TOPICAL_OINTMENT | CUTANEOUS | Status: DC | PRN

## 2023-09-30 MED ORDER — WITCH HAZEL-GLYCERIN EX PADS: 1.0000 | MEDICATED_PAD | CUTANEOUS | Status: DC | PRN

## 2023-09-30 MED ORDER — PRENATAL MULTIVITAMIN CH
1.0000 | ORAL_TABLET | Freq: Every day | ORAL | Status: DC
  Administered 2023-09-30 – 2023-10-01 (×2): 1 via ORAL
  Filled 2023-09-30 (×2): qty 1

## 2023-09-30 MED ORDER — FENTANYL CITRATE (PF) 100 MCG/2ML IJ SOLN: 50.0000 ug | INTRAMUSCULAR | Status: DC | PRN

## 2023-09-30 MED ORDER — LACTATED RINGERS IV SOLN: 500.0000 mL | Freq: Once | INTRAVENOUS | Status: DC

## 2023-09-30 MED ORDER — OXYTOCIN-SODIUM CHLORIDE 30-0.9 UT/500ML-% IV SOLN
2.5000 [IU]/h | INTRAVENOUS | Status: DC
  Filled 2023-09-30: qty 500

## 2023-09-30 MED ORDER — IBUPROFEN 600 MG PO TABS
600.0000 mg | ORAL_TABLET | Freq: Four times a day (QID) | ORAL | Status: DC
  Administered 2023-09-30 – 2023-10-01 (×5): 600 mg via ORAL
  Filled 2023-09-30 (×5): qty 1

## 2023-09-30 MED ORDER — BENZOCAINE-MENTHOL 20-0.5 % EX AERO: 1.0000 | INHALATION_SPRAY | CUTANEOUS | Status: DC | PRN

## 2023-09-30 MED ORDER — ZOLPIDEM TARTRATE 5 MG PO TABS: 5.0000 mg | ORAL_TABLET | Freq: Every evening | ORAL | Status: DC | PRN

## 2023-09-30 MED ORDER — PHENYLEPHRINE 80 MCG/ML (10ML) SYRINGE FOR IV PUSH (FOR BLOOD PRESSURE SUPPORT): 80.0000 ug | PREFILLED_SYRINGE | INTRAVENOUS | Status: DC | PRN

## 2023-09-30 MED ORDER — SERTRALINE HCL 50 MG PO TABS
50.0000 mg | ORAL_TABLET | Freq: Every day | ORAL | Status: DC
  Administered 2023-09-30 – 2023-10-01 (×2): 50 mg via ORAL
  Filled 2023-09-30 (×2): qty 1

## 2023-09-30 MED ORDER — OXYCODONE HCL 5 MG PO TABS: 5.0000 mg | ORAL_TABLET | ORAL | Status: DC | PRN

## 2023-09-30 MED ORDER — LACTATED RINGERS IV SOLN: INTRAVENOUS | Status: DC

## 2023-09-30 MED ORDER — EPHEDRINE 5 MG/ML INJ: 10.0000 mg | INTRAVENOUS | Status: DC | PRN

## 2023-09-30 MED ORDER — METRONIDAZOLE 500 MG PO TABS: 500.0000 mg | ORAL_TABLET | Freq: Two times a day (BID) | ORAL | 0 refills | Status: AC

## 2023-09-30 MED ORDER — TERBUTALINE SULFATE 1 MG/ML IJ SOLN
INTRAMUSCULAR | Status: AC
  Administered 2023-09-30: 0.25 mg via SUBCUTANEOUS
  Filled 2023-09-30: qty 1

## 2023-09-30 MED ORDER — TERBUTALINE SULFATE 1 MG/ML IJ SOLN: 0.2500 mg | Freq: Once | INTRAMUSCULAR | Status: DC

## 2023-09-30 MED ORDER — OXYCODONE-ACETAMINOPHEN 5-325 MG PO TABS: 2.0000 | ORAL_TABLET | ORAL | Status: DC | PRN

## 2023-09-30 MED ORDER — SIMETHICONE 80 MG PO CHEW: 80.0000 mg | CHEWABLE_TABLET | ORAL | Status: DC | PRN

## 2023-09-30 MED ORDER — BISACODYL 10 MG RE SUPP: 10.0000 mg | Freq: Every day | RECTAL | Status: DC | PRN

## 2023-09-30 MED ORDER — DIPHENHYDRAMINE HCL 50 MG/ML IJ SOLN
12.5000 mg | INTRAMUSCULAR | Status: DC | PRN
Start: 2023-09-30 — End: 2023-09-30

## 2023-09-30 MED ORDER — ONDANSETRON HCL 4 MG/2ML IJ SOLN
4.0000 mg | Freq: Four times a day (QID) | INTRAMUSCULAR | Status: DC | PRN
  Administered 2023-09-30: 4 mg via INTRAVENOUS
  Filled 2023-09-30: qty 2

## 2023-09-30 MED ORDER — SODIUM CHLORIDE 0.9 % IV SOLN
5.0000 10*6.[IU] | Freq: Once | INTRAVENOUS | Status: AC
  Administered 2023-09-30: 5 10*6.[IU] via INTRAVENOUS
  Filled 2023-09-30: qty 5

## 2023-09-30 MED ORDER — OXYTOCIN BOLUS FROM INFUSION
333.0000 mL | Freq: Once | INTRAVENOUS | Status: AC
  Administered 2023-09-30: 333 mL via INTRAVENOUS

## 2023-09-30 MED ORDER — DIPHENHYDRAMINE HCL 50 MG/ML IJ SOLN: 12.5000 mg | INTRAMUSCULAR | Status: DC | PRN

## 2023-09-30 MED ORDER — FLEET ENEMA RE ENEM: 1.0000 | ENEMA | Freq: Every day | RECTAL | Status: DC | PRN

## 2023-09-30 MED ORDER — TERBUTALINE SULFATE 1 MG/ML IJ SOLN: 0.2500 mg | Freq: Once | INTRAMUSCULAR | Status: AC | PRN

## 2023-09-30 MED ORDER — COCONUT OIL OIL
1.0000 | TOPICAL_OIL | Status: DC | PRN
  Administered 2023-10-01: 1 via TOPICAL

## 2023-09-30 MED ORDER — LIDOCAINE HCL (PF) 1 % IJ SOLN
INTRAMUSCULAR | Status: DC | PRN
Start: 2023-09-30 — End: 2023-09-30
  Administered 2023-09-30: 8 mL via EPIDURAL

## 2023-09-30 MED ORDER — SOD CITRATE-CITRIC ACID 500-334 MG/5ML PO SOLN: 30.0000 mL | ORAL | Status: DC | PRN

## 2023-09-30 MED ORDER — FENTANYL-BUPIVACAINE-NACL 0.5-0.125-0.9 MG/250ML-% EP SOLN: 12.0000 mL/h | EPIDURAL | Status: DC | PRN

## 2023-09-30 MED ORDER — LACTATED RINGERS IV SOLN: 500.0000 mL | INTRAVENOUS | Status: DC | PRN

## 2023-09-30 NOTE — MAU Note (Signed)
 Crystal Jennings is a 23 y.o. at [redacted]w[redacted]d here in MAU reporting: returned to MAU after discharge earlier this morning for worsening ctx and possible ROM.   Onset of complaint: yesterday Pain score: 10 Vitals:   09/30/23 0500  BP: 123/74  Pulse: 90  Resp: (!) 22  SpO2: 100%     FHT: 145  Lab orders placed from triage: MAU labor

## 2023-09-30 NOTE — MAU Provider Note (Incomplete)
 History     CSN: [id number]  Arrival date and time: 09/29/23 1825   Event Date/Time   First Provider Initiated Contact with Patient 09/29/23 1915      Chief Complaint  Patient presents with  . Decreased Fetal Movement  . Contractions   HPI  Ms. Crystal Jennings is a 23 y.o. G1P0 female at [redacted]w[redacted]d weeks gestation who presents to MAU reporting DFM w/ cramping and vaginal bleeding.  Pregnancy is complicated by late to care.    The patient and her mother were in the room. The patient reports that [place] she [place] her [place] she felt sudden pelvic cramping and soon after went to the bathroom and noticed some streaks of blood in her vaginal discharge as well as a few clots. She denied any leakage of fluid. Since this incident yesterday, she has only felt her baby move one time. She has continued to have intermittent pelvic pain that occasionally radiates to her back. Since yesterday she also feels like she has been unable to fully empty her bladder. She denies abnormal discharge or burning with urination or abnormal bowel movements. She also denies any vaginal bleeding since the incident yesterday.     OB History     Gravida  1   Para      Term      Preterm      AB      Living         SAB      IAB      Ectopic      Multiple      Live Births              Past Medical History:  Diagnosis Date  . Asthma   . Hearing impairment     Past Surgical History:  Procedure Laterality Date  . NO PAST SURGERIES      Family History  Problem Relation Age of Onset  . Diabetes Neg Hx   . Hypertension Neg Hx     Social History   Tobacco Use  . Smoking status: Never  . Smokeless tobacco: Never  Vaping Use  . Vaping status: Never Used  Substance Use Topics  . Alcohol use: Never  . Drug use: Never    Allergies: No Known Allergies  Medications Prior to Admission  Medication Sig Dispense Refill Last Dose/Taking  . famotidine   (PEPCID ) 20 MG tablet Take 1 tablet (20 mg total) by mouth 2 (two) times daily. 60 tablet 3 09/29/2023  . Prenatal Vit-Fe Fumarate-FA (PREPLUS) 27-1 MG TABS Take 1 tablet by mouth daily. 30 tablet 13 09/29/2023  . sertraline  (ZOLOFT ) 50 MG tablet Take 1 tablet (50 mg total) by mouth daily. 30 tablet 11 09/29/2023  . cetirizine  (ZYRTEC ) 10 MG tablet Take 1 tablet (10 mg total) by mouth daily. (Patient not taking: Reported on 09/02/2023) 30 tablet 11    Results for orders placed or performed during the hospital encounter of 09/29/23 (from the past 48 hours)  POCT fern test     Status: Normal   Collection Time: 09/29/23  7:13 PM  Result Value Ref Range   POCT Fern Test Negative = intact amniotic membranes   Wet prep, genital     Status: Abnormal   Collection Time: 09/29/23  7:56 PM   Specimen: Vaginal  Result Value Ref Range   Yeast Wet Prep HPF POC NONE SEEN NONE SEEN   Trich, Wet Prep NONE SEEN NONE SEEN  Clue Cells Wet Prep HPF POC PRESENT (A) NONE SEEN   WBC, Wet Prep HPF POC >=10 (A) <10   Sperm NONE SEEN     Comment: Performed at [facility], [address], [place], [place] [address]    Review of Systems  Constitutional:  Negative for fever.  Genitourinary:  Positive for vaginal discharge (bloody show).   Physical Exam   Blood pressure 113/79, pulse 85, temperature (!) 97.5 F (36.4 C), temperature source Oral, resp. rate 16, height 5' 2 (1.575 m), weight 90.5 kg, last menstrual period 12/18/2022, SpO2 98%.  Physical Exam Constitutional:      General: She is not in acute distress.    Appearance: Normal appearance. She is not ill-appearing, toxic-appearing or diaphoretic.  Genitourinary:    Comments: Dilation: 2.5 Effacement (%): 100 Cervical Position: Middle Station: -2 Presentation: Vertex Exam by:: weston,rn  Skin:    General: Skin is warm.  Neurological:     Mental Status: She is alert and oriented to person, place, and time.    Category 1 fetal  tracing.  MAU Course  Procedures  MDM  Initial exam 2 cm, 100% -2. Patient uncomfortable so she was checked 45 minutes later Dilation: 2.5 Effacement (%): 100 Cervical Position: Middle Station: -2 Presentation: Vertex Exam by:: weston,rn   Report given to labor team Dr. Loyola and Dr. Von. Will monitor and recheck pain in 1 hour.  She is likely in early labor. + GBS  Rasch, Delon FERNS, NP 09/29/2023 8:42 PM   Assessment and Plan  Minimal cervical change after 5 hours Desires something for pain, will give Oxycodone -acetominophen 10 mg/650mg  once  Discharge home with strict return precautions.  Continue with scheduled routine prenatal care.   Mardy Loyola, MD FMOB Fellow, Faculty practice [facility], Center for [facility]

## 2023-09-30 NOTE — Discharge Summary (Shared)
 Postpartum Discharge Summary  Date of Service updated***     Patient Name: Crystal Jennings DOB: October 22, 2000 MRN: [id number]  Date of admission: 09/30/2023 Delivery date:09/30/2023 Delivering provider: CLAUDENE, [place]  Date of discharge: 09/30/2023  Admitting diagnosis: Normal labor [O80, Z37.9] Intrauterine pregnancy: [redacted]w[redacted]d     Secondary diagnosis:  Principal Problem:   Normal labor Active Problems:   Vaginal delivery   Retained placenta  Additional problems: ***    Discharge diagnosis: Term Pregnancy Delivered                                              Post partum procedures:none Augmentation: N/A Complications: None  Hospital course: Onset of Labor With Vaginal Delivery      23 y.o. yo G1P0 at [redacted]w[redacted]d was admitted in Active Labor on 09/30/2023. Labor course was complicated by nothing  Membrane Rupture Time/Date: 6:04 AM,09/30/2023  Delivery Method:Vaginal, Spontaneous Operative Delivery:N/A Episiotomy: None Lacerations:  2nd degree Patient had a postpartum course complicated by ***.  She is ambulating, tolerating a regular diet, passing flatus, and urinating well. Patient is discharged home in stable condition on 09/30/23.  Newborn Data: Birth date:09/30/2023 Birth time:10:31 AM Gender:Female Living status:Living Apgars:7 ,9  Weight:   Magnesium Sulfate received: No BMZ received: No Rhophylac:N/A MMR:N/A T-DaP:Given prenatally Flu: No RSV Vaccine received: No Transfusion:{Transfusion received:30440034}  Immunizations received: Immunization History  Administered Date(s) Administered   Tdap 01/19/2021, 08/05/2023    Physical exam  Vitals:   09/30/23 1048 09/30/23 1100 09/30/23 1116 09/30/23 1130  BP: (!) 106/53 (!) 106/50 93/69 110/65  Pulse: (!) 117 (!) 110 (!) 103 99  Resp:      Temp:      TempSrc:      SpO2:      Weight:      Height:       General: {Exam; general:21111117} Lochia: {Desc;  appropriate/inappropriate:30686::appropriate} Uterine Fundus: {Desc; firm/soft:30687} Incision: {Exam; incision:21111123} DVT Evaluation: {Exam; dvt:2111122} Labs: Lab Results  Component Value Date   WBC 18.1 (H) 09/30/2023   HGB 13.7 09/30/2023   HCT 40.2 09/30/2023   MCV 80.9 09/30/2023   PLT 286 09/30/2023      Latest Ref Rng & Units 09/30/2023    5:01 AM  CMP  Glucose 70 - 99 mg/dL 896   BUN 6 - 20 mg/dL <5   Creatinine 9.55 - 1.00 mg/dL 9.35   Sodium 864 - 854 mmol/L 133   Potassium 3.5 - 5.1 mmol/L 3.7   Chloride 98 - 111 mmol/L 102   CO2 22 - 32 mmol/L 17   Calcium 8.9 - 10.3 mg/dL 9.1   Total Protein 6.5 - 8.1 g/dL 7.4   Total Bilirubin 0.0 - 1.2 mg/dL 0.8   Alkaline Phos 38 - 126 U/L 131   AST 15 - 41 U/L 22   ALT 0 - 44 U/L 13    Edinburgh Score:     No data to display         No data recorded  After visit meds:  Allergies as of 09/30/2023   No Known Allergies   Med Rec must be completed prior to using this [facility]***        Discharge home in stable condition Infant Feeding: Breast Infant Disposition:{CHL IP OB HOME WITH FNUYZM:76418} Discharge instruction: per After Visit Summary and Postpartum booklet. Activity: Advance as tolerated. Pelvic  rest for 6 weeks.  Diet: {OB ipzu:78888878} Future Appointments: Future Appointments  Date Time Provider Department Center  10/02/2023 11:15 AM Zina Jerilynn LABOR, MD [facility] None   Follow up Visit:   Please schedule this patient for a In person postpartum visit in 4 weeks with the following provider: Any provider. Additional Postpartum F/U:nothing  Low risk pregnancy complicated by: nothing Delivery mode:  Vaginal, Spontaneous Anticipated Birth Control:  PP Nexplanon  placed   09/30/2023 [place]  Claudene, CNM

## 2023-09-30 NOTE — Lactation Note (Addendum)
 This note was copied from a baby's chart. Lactation Consultation Note  Patient Name: Crystal Jennings Unijb'd Date: 09/30/2023 Age:22 hours Reason for consult: Initial assessment;1st time breastfeeding;Primapara;Early term 37-38.6wks  P1, 38 wks, @ 5 hrs of life. Encouraged mom to put baby to breast every 3 hours, may need to wake baby on first day. Mom receptive. Demonstrated cross-cradle on right breast, football on left. Started with hand expression, if baby sleepy- demonstrated expressing drops to baby and worked on Administrator. Demonstrated a couple times on first breast- several minutes of work and several free drops-  baby more awake for second breast. 5 minutes on each breast- 10 minutes overall this feed.  1600- Received STORK PUMP and hand pump provided to mom.  Maternal Data Has patient been taught Hand Expression?: Yes Does the patient have breastfeeding experience prior to this delivery?: No  Feeding Mother's Current Feeding Choice: Breast Milk  LATCH Score Latch: Grasps breast easily, tongue down, lips flanged, rhythmical sucking.  Audible Swallowing: Spontaneous and intermittent  Type of Nipple: Everted at rest and after stimulation  Comfort (Breast/Nipple): Soft / non-tender  Hold (Positioning): Full assist, staff holds infant at breast  LATCH Score: 8   Lactation Tools Discussed/Used Tools: Flanges;Pump Flange Size: 21 Breast pump type: Manual Pump Education: Milk Storage  Interventions Interventions: Breast feeding basics reviewed;Assisted with latch;Hand express;Breast compression;Support pillows;Position options;Education;LC Services brochure;CDC milk storage guidelines  Discharge Pump: Referral sent for Stork Pump;Manual (Provided hand pump)  Consult Status Consult Status: Follow-up Date: 10/01/23 Follow-up type: In-patient    Mikle Sternberg 09/30/2023, 4:01 PM

## 2023-09-30 NOTE — Plan of Care (Signed)

## 2023-09-30 NOTE — Anesthesia Preprocedure Evaluation (Signed)
 Anesthesia Evaluation  Patient identified by MRN, date of birth, ID band Patient awake    Reviewed: Allergy & Precautions, H&P , NPO status , Patient's Chart, lab work & pertinent test results, reviewed documented beta blocker date and time   Airway Mallampati: III  TM Distance: >3 FB Neck ROM: full    Dental no notable dental hx. (+) Teeth Intact, Dental Advisory Given   Pulmonary neg pulmonary ROS   Pulmonary exam normal breath sounds clear to auscultation       Cardiovascular Exercise Tolerance: Good negative cardio ROS  Rhythm:regular Rate:Normal     Neuro/Psych negative neurological ROS  negative psych ROS   GI/Hepatic negative GI ROS, Neg liver ROS,,,  Endo/Other  negative endocrine ROS    Renal/GU negative Renal ROS  negative genitourinary   Musculoskeletal   Abdominal   Peds  Hematology negative hematology ROS (+)   Anesthesia Other Findings   Reproductive/Obstetrics negative OB ROS                              Anesthesia Physical Anesthesia Plan  ASA: 2  Anesthesia Plan: General   Post-op Pain Management:    Induction: Intravenous  PONV Risk Score and Plan: 3 and Treatment may vary due to age or medical condition  Airway Management Planned:   Additional Equipment: Fetal Monitoring  Intra-op Plan:   Post-operative Plan:   Informed Consent: I have reviewed the patients History and Physical, chart, labs and discussed the procedure including the risks, benefits and alternatives for the proposed anesthesia with the patient or authorized representative who has indicated his/her understanding and acceptance.     Dental Advisory Given  Plan Discussed with: CRNA and Anesthesiologist  Anesthesia Plan Comments:         Anesthesia Quick Evaluation

## 2023-09-30 NOTE — Anesthesia Procedure Notes (Signed)
 Epidural Patient location during procedure: OB Start time: 09/30/2023 5:48 AM End time: 09/30/2023 5:52 AM  Staffing Anesthesiologist: Mallory Manus, MD  Preanesthetic Checklist Completed: patient identified, IV checked, site marked, risks and benefits discussed, surgical consent, monitors and equipment checked, pre-op evaluation and timeout performed  Epidural Patient position: sitting Prep: DuraPrep and site prepped and draped Patient monitoring: continuous pulse ox and blood pressure Approach: midline Location: L4-L5 Injection technique: LOR air  Needle:  Needle type: Tuohy  Needle gauge: 17 G Needle length: 9 cm and 9 Needle insertion depth: 7 cm Catheter type: closed end flexible Catheter size: 19 Gauge Catheter at skin depth: 13 cm Test dose: negative  Assessment Events: blood not aspirated, no cerebrospinal fluid, injection not painful, no injection resistance, no paresthesia and negative IV test

## 2023-09-30 NOTE — Lactation Note (Signed)
 This note was copied from a baby's chart. Lactation Consultation Note  Patient Name: Crystal Jennings Date: 09/30/2023 Age:22 hours Reason for consult: Initial assessment;Primapara;Early term 66-38.6wks Mom stated the baby favors the Lt. Nipple and it was sore. Appeared intact. Hand expression taught and encouraged to leave colostrum on nipples. Positioning options reviewed. Baby off and on a lot, holding nipple in her mouth, suckling at long pause intervals. Encouraged mom to stimulate baby to open wider by stroking top lip. Mom found helpful. Newborn feeding habits, STS, I&O, body alignment, positioning, support reviewed. Mom encouraged to feed baby 8-12 times/24 hours and with feeding cues.  Mom stated MGM gave some formula because the baby wouldn't eat hardly at last feeding. Noted baby spit up a little curdled formula before feeding at the breast. Encouraged mom to BF before giving formula. Encouraged mom to call for assistance or questions.   Maternal Data Has patient been taught Hand Expression?: Yes Does the patient have breastfeeding experience prior to this delivery?: No  Feeding Mother's Current Feeding Choice: Breast Milk and Formula  LATCH Score Latch: Repeated attempts needed to sustain latch, nipple held in mouth throughout feeding, stimulation needed to elicit sucking reflex.  Audible Swallowing: None  Type of Nipple: Everted at rest and after stimulation  Comfort (Breast/Nipple): Filling, red/small blisters or bruises, mild/mod discomfort (Lt. nipple sore per mom/intact)  Hold (Positioning): Assistance needed to correctly position infant at breast and maintain latch.  LATCH Score: 5   Lactation Tools Discussed/Used    Interventions Interventions: Breast feeding basics reviewed;Assisted with latch;Skin to skin;Breast massage;Hand express;Breast compression;Adjust position;Support pillows;Position options;Education;LC Services brochure;CDC  milk storage guidelines  Discharge    Consult Status Consult Status: Follow-up Date: 10/01/23 Follow-up type: In-patient    Crystal Jennings 09/30/2023, 9:12 PM
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
## 2023-10-01 DIAGNOSIS — Z30017 Encounter for initial prescription of implantable subdermal contraceptive: Secondary | ICD-10-CM

## 2023-10-01 MED ORDER — SENNOSIDES-DOCUSATE SODIUM 8.6-50 MG PO TABS
2.0000 | ORAL_TABLET | Freq: Every day | ORAL | 0 refills | Status: AC
Start: 2023-10-01 — End: ?

## 2023-10-01 MED ORDER — ACETAMINOPHEN 325 MG PO TABS: 650.0000 mg | ORAL_TABLET | ORAL | 0 refills | Status: AC | PRN

## 2023-10-01 MED ORDER — LIDOCAINE HCL 1 % IJ SOLN
0.0000 mL | Freq: Once | INTRAMUSCULAR | Status: AC | PRN
  Administered 2023-10-01: 20 mL via INTRADERMAL
  Filled 2023-10-01: qty 20

## 2023-10-01 MED ORDER — ETONOGESTREL 68 MG ~~LOC~~ IMPL
68.0000 mg | DRUG_IMPLANT | Freq: Once | SUBCUTANEOUS | Status: AC
  Administered 2023-10-01: 68 mg via SUBCUTANEOUS
  Filled 2023-10-01: qty 1

## 2023-10-01 MED ORDER — IBUPROFEN 600 MG PO TABS: 600.0000 mg | ORAL_TABLET | Freq: Four times a day (QID) | ORAL | 0 refills | Status: AC

## 2023-10-01 MED ORDER — PRENATAL MULTIVITAMIN CH
1.0000 | ORAL_TABLET | Freq: Every day | ORAL | 0 refills | Status: AC
Start: 2023-10-01 — End: ?
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
## 2023-10-01 NOTE — Procedures (Signed)
 PRE-OP DIAGNOSIS: desired long-term, reversible contraception   POST-OP DIAGNOSIS: Same   PROCEDURE: Nexplanon   placement  Performing Physician: Devaughn Ban, MD supervising Chiquita Clover, MD   PROCEDURE:  Site (check): left arm        Sterile Preparation:    Betadine        First I obtained written informed consent including discussion of risk of bleeding, infection, and damage to surrounding tissue, as well as discussion of side effects including unscheduled bleeding. Next, a time-out was performed, and then an insertion site was selected 6 - 10 cm from medial epicondyle and marked. Procedure area was prepped and draped in a sterile fashion. 3 mL of 1% lidocaine  w/o epinephrine  was used for subcutaneous anesthesia. Anesthesia confirmed.  Nexplanon   trocar was inserted subcutaneously and then Nexplanon   capsule delivered subcutaneously. Trocar was removed from the insertion site. Nexplanon   capsule was palpated by provider and patient to assure satisfactory placement.  Estimated blood loss: minimal Dressings applied: steri-strip, band-aid, coband Followup: The patient tolerated the procedure well without complications.  Standard post-procedure care wass explained and return precautions were given.

## 2023-10-01 NOTE — Discharge Instructions (Signed)

## 2023-10-01 NOTE — Plan of Care (Signed)
 Problem: Education: Goal: Knowledge of General Education information will improve Description: Including pain rating scale, medication(s)/side effects and non-pharmacologic comfort measures 10/01/2023 1125 by Madison Rosina LABOR, LPN Outcome: Adequate for Discharge 10/01/2023 0709 by Madison Rosina LABOR, LPN Outcome: Progressing   Problem: Health Behavior/Discharge Planning: Goal: Ability to manage health-related needs will improve 10/01/2023 1125 by Madison Rosina LABOR, LPN Outcome: Adequate for Discharge 10/01/2023 0709 by Madison Rosina LABOR, LPN Outcome: Progressing   Problem: Clinical Measurements: Goal: Ability to maintain clinical measurements within normal limits will improve 10/01/2023 1125 by Madison Rosina LABOR, LPN Outcome: Adequate for Discharge 10/01/2023 0709 by Madison Rosina LABOR, LPN Outcome: Progressing Goal: Will remain free from infection 10/01/2023 1125 by Madison Rosina LABOR, LPN Outcome: Adequate for Discharge 10/01/2023 0709 by Madison Rosina LABOR, LPN Outcome: Progressing Goal: Diagnostic test results will improve 10/01/2023 1125 by Madison Rosina LABOR, LPN Outcome: Adequate for Discharge 10/01/2023 0709 by Madison Rosina LABOR, LPN Outcome: Progressing Goal: Respiratory complications will improve 10/01/2023 1125 by Madison Rosina LABOR, LPN Outcome: Adequate for Discharge 10/01/2023 0709 by Madison Rosina LABOR, LPN Outcome: Progressing Goal: Cardiovascular complication will be avoided 10/01/2023 1125 by Madison Rosina LABOR, LPN Outcome: Adequate for Discharge 10/01/2023 0709 by Madison Rosina LABOR, LPN Outcome: Progressing   Problem: Activity: Goal: Risk for activity intolerance will decrease 10/01/2023 1125 by Madison Rosina LABOR, LPN Outcome: Adequate for Discharge 10/01/2023 0709 by Madison Rosina LABOR, LPN Outcome: Progressing   Problem: Nutrition: Goal: Adequate nutrition will be maintained 10/01/2023 1125 by Madison Rosina LABOR, LPN Outcome: Adequate for Discharge 10/01/2023 0709 by Madison Rosina LABOR, LPN Outcome: Progressing    Problem: Coping: Goal: Level of anxiety will decrease 10/01/2023 1125 by Madison Rosina LABOR, LPN Outcome: Adequate for Discharge 10/01/2023 0709 by Madison Rosina LABOR, LPN Outcome: Progressing   Problem: Elimination: Goal: Will not experience complications related to bowel motility 10/01/2023 1125 by Madison Rosina LABOR, LPN Outcome: Adequate for Discharge 10/01/2023 0709 by Madison Rosina LABOR, LPN Outcome: Progressing Goal: Will not experience complications related to urinary retention 10/01/2023 1125 by Madison Rosina LABOR, LPN Outcome: Adequate for Discharge 10/01/2023 0709 by Madison Rosina LABOR, LPN Outcome: Progressing   Problem: Pain Managment: Goal: General experience of comfort will improve and/or be controlled 10/01/2023 1125 by Madison Rosina LABOR, LPN Outcome: Adequate for Discharge 10/01/2023 0709 by Madison Rosina LABOR, LPN Outcome: Progressing   Problem: Safety: Goal: Ability to remain free from injury will improve 10/01/2023 1125 by Madison Rosina LABOR, LPN Outcome: Adequate for Discharge 10/01/2023 0709 by Madison Rosina LABOR, LPN Outcome: Progressing   Problem: Skin Integrity: Goal: Risk for impaired skin integrity will decrease 10/01/2023 1125 by Madison Rosina LABOR, LPN Outcome: Adequate for Discharge 10/01/2023 0709 by Madison Rosina LABOR, LPN Outcome: Progressing   Problem: Education: Goal: Knowledge of condition will improve 10/01/2023 1125 by Madison Rosina LABOR, LPN Outcome: Adequate for Discharge 10/01/2023 0709 by Madison Rosina LABOR, LPN Outcome: Progressing Goal: Individualized Educational Video(s) 10/01/2023 1125 by Madison Rosina LABOR, LPN Outcome: Adequate for Discharge 10/01/2023 0709 by Madison Rosina LABOR, LPN Outcome: Progressing Goal: Individualized Newborn Educational Video(s) 10/01/2023 1125 by Madison Rosina LABOR, LPN Outcome: Adequate for Discharge 10/01/2023 0709 by Madison Rosina LABOR, LPN Outcome: Progressing   Problem: Activity: Goal: Will verbalize the importance of balancing activity with adequate rest  periods 10/01/2023 1125 by Madison Rosina LABOR, LPN Outcome: Adequate for Discharge 10/01/2023 0709 by Madison Rosina LABOR, LPN Outcome: Progressing Goal: Ability to tolerate increased activity will improve 10/01/2023 1125 by Madison Rosina LABOR, LPN  Outcome: Adequate for Discharge 10/01/2023 0709 by Madison Rosina LABOR, LPN Outcome: Progressing   Problem: Coping: Goal: Ability to identify and utilize available resources and services will improve 10/01/2023 1125 by Madison Rosina LABOR, LPN Outcome: Adequate for Discharge 10/01/2023 0709 by Madison Rosina LABOR, LPN Outcome: Progressing   Problem: Life Cycle: Goal: Chance of risk for complications during the postpartum period will decrease 10/01/2023 1125 by Madison Rosina LABOR, LPN Outcome: Adequate for Discharge 10/01/2023 0709 by Madison Rosina LABOR, LPN Outcome: Progressing   Problem: Role Relationship: Goal: Ability to demonstrate positive interaction with newborn will improve 10/01/2023 1125 by Madison Rosina LABOR, LPN Outcome: Adequate for Discharge 10/01/2023 0709 by Madison Rosina LABOR, LPN Outcome: Progressing   Problem: Skin Integrity: Goal: Demonstration of wound healing without infection will improve 10/01/2023 1125 by Madison Rosina LABOR, LPN Outcome: Adequate for Discharge 10/01/2023 0709 by Madison Rosina LABOR, LPN Outcome: Progressing

## 2023-10-01 NOTE — Anesthesia Postprocedure Evaluation (Signed)
 Anesthesia Post Note  Patient: Crystal Jennings  Procedure(s) Performed: AN AD HOC LABOR EPIDURAL     Patient location during evaluation: Mother Baby Anesthesia Type: General Level of consciousness: awake, oriented and awake and alert Pain management: pain level controlled Vital Signs Assessment: post-procedure vital signs reviewed and stable Respiratory status: spontaneous breathing, nonlabored ventilation and respiratory function stable Cardiovascular status: stable Postop Assessment: no headache, patient able to bend at knees, adequate PO intake, able to ambulate and no apparent nausea or vomiting Anesthetic complications: no   No notable events documented.  Last Vitals:  Vitals:   10/01/23 0000 10/01/23 0533  BP: 110/67 116/68  Pulse: 71 76  Resp: 18 18  Temp: 36.8 C 36.7 C  SpO2: 100% 100%    Last Pain:  Vitals:   10/01/23 0533  TempSrc: Oral  PainSc:    Pain Goal:                   Mishaal Lansdale

## 2023-10-01 NOTE — Lactation Note (Addendum)
 This note was copied from a baby's chart. Lactation Consultation Note  Patient Name: Crystal Jennings Unijb'd Date: 10/01/2023 Age:22 hours Reason for consult: Follow-up assessment;1st time breastfeeding;Early term 37-38.6wks  P1, Baby is primarily breastfeeding.  Praised mother for her efforts.  She states her L nipple is tender, not too painful per mother and has coconut oil.   Mother did offer formula at 0600.  Baby consumed 25 ml.  Offered reassurance.   Encouarged mother to offer the breast first before formula. Suggest calling for LC to view latch as desired to see if LC can help with latch. Reviewed engorgement care and monitoring voids/stools.   Maternal Data Has patient been taught Hand Expression?: Yes  Feeding Mother's Current Feeding Choice: Breast Milk and Formula  Lactation Tools Discussed/Used Tools: Pump;Coconut oil Breast pump type: Double-Electric Breast Pump;Manual  Interventions Interventions: Education  Discharge Discharge Education: Engorgement and breast care;Warning signs for feeding baby Pump: Personal;DEBP;Manual;Received Stork Pump [employer])  Consult Status Consult Status: Follow-up Date: 10/02/23 Follow-up type: In-patient   [name]  RN, IBCLC 10/01/2023, 8:32 AM

## 2023-10-01 NOTE — Procedures (Deleted)
 Crystal Jennings 22 y.o. Vitals:   10/01/23 0000 10/01/23 0533  BP: 110/67 116/68  Pulse: 71 76  Resp: 18 18  Temp: 98.3 F (36.8 C) 98.1 F (36.7 C)  SpO2: 100% 100%    Past Medical History:  Diagnosis Date   Asthma    Hearing impairment     Family History  Problem Relation Age of Onset   Diabetes Neg Hx    Hypertension Neg Hx     Social History   Socioeconomic History   Marital status: Single    Spouse name: Not on file   Number of children: Not on file   Years of education: Not on file   Highest education level: Not on file  Occupational History   Not on file  Tobacco Use   Smoking status: Never   Smokeless tobacco: Never  Vaping Use   Vaping status: Never Used  Substance and Sexual Activity   Alcohol use: Never   Drug use: Never   Sexual activity: Not on file  Other Topics Concern   Not on file  Social History Narrative   Not on file   Social Drivers of Health   Financial Resource Strain: Not on File (07/06/2021)   Received from [employer]    Financial Resource Strain: 0  Food Insecurity: No Food Insecurity (09/30/2023)   Hunger Vital Sign    Worried About Running Out of Food in the Last Year: Never true    Ran Out of Food in the Last Year: Never true  Transportation Needs: Patient Declined (09/30/2023)   PRAPARE - Administrator, Civil Service (Medical): Patient declined    Lack of Transportation (Non-Medical): Patient declined  Physical Activity: Not on File (07/06/2021)   Received from [facility]   Physical Activity    Physical Activity: 0  Stress: Not on File (07/06/2021)   Received from [facility]   Stress    Stress: 0  Social Connections: Not on File (11/26/2022)   Received from [facility]   Social Connections    Connectedness: 0  Intimate Partner Violence: Patient Declined (09/30/2023)   Humiliation, Afraid, Rape, and Kick questionnaire    Fear of Current or Ex-Partner: Patient declined    Emotionally Abused:  Patient declined    Physically Abused: Patient declined    Sexually Abused: Patient declined    HPI:  Crystal Jennings desires postpartum nexplanon  insertion.  She was given informed consent for insertion of a Nexplanon . A signed copy is in the chart. Appropriate time out taken. Nexlanon site (left arm) identified and thea area was prepped in usual sterile fashon.  6cc of 1% lidocaine  was used to anesthetize the area starting with the distal end of the implant. Next, the area was cleansed again and the Nexplanon  was inserted without difficulty. There were no complications. Pressure and bandage was applied. Minimal blood loss.  Procedure performed by Dr Terri, supervised by Dr. Kandis   Pt was instructed to remove pressure bandage in a few hours, and keep insertion site covered with a bandaid for 3 days.  Follow-up scheduled PRN problems   Lot number: [id number] 08/17/25    Terri Lacks, MD 10/01/2023 11:14 AM

## 2023-10-01 NOTE — Progress Notes (Signed)
 POSTPARTUM PROGRESS NOTE  Post Partum Day 1  Subjective:  Crystal Jennings is a 23 y.o. G1P1001 s/p NSVD at [redacted]w[redacted]d.  No acute events overnight.  Pt denies problems with ambulating, voiding or po intake.  She denies nausea or vomiting.  Pain is well controlled.  She has had flatus. She has not had bowel movement.  Lochia Small.   Objective: Blood pressure 116/68, pulse 76, temperature 98.1 F (36.7 C), temperature source Oral, resp. rate 18, height 5' 2 (1.575 m), weight 90.3 kg, last menstrual period 12/18/2022, SpO2 100%, unknown if currently breastfeeding.  Physical Exam:  General: alert, cooperative and no distress Chest: no respiratory distress Heart:regular rate, distal pulses intact Abdomen: soft, nontender,  Uterine Fundus: firm, appropriately tender DVT Evaluation: No calf swelling or tenderness Extremities: minimal edema   Recent Labs    09/30/23 0501  HGB 13.7  HCT 40.2    Assessment/Plan: Crystal Jennings is a 23 y.o. G1P1001 s/p NSVD at [redacted]w[redacted]d   PPD#1 - Doing well Contraception: nexplanon  Feeding: breast and bottle Dispo: Plan for discharge tomorrow, feels like she needs another day of support but could be discharged today.   LOS: 1 day   Chiquita Clover, MD PGY3 10/01/2023, 8:32 AM

## 2023-10-01 NOTE — Plan of Care (Signed)

## 2023-10-02 ENCOUNTER — Telehealth (HOSPITAL_COMMUNITY): Payer: Self-pay | Admitting: *Deleted

## 2023-10-02 ENCOUNTER — Encounter: Admitting: Obstetrics and Gynecology

## 2023-10-02 DIAGNOSIS — Z1331 Encounter for screening for depression: Secondary | ICD-10-CM

## 2023-10-02 NOTE — Telephone Encounter (Signed)
 Patient scored 16 on EPDS in the hospital, answer to question ten was 1-Hardly Ever.  Placed order for [facility] referral.  Spoke to Dr. Lola via [employer] and shared above information.  Mliss Sieve, RN 10/02/2023 15:21

## 2023-10-04 ENCOUNTER — Encounter: Payer: Self-pay | Admitting: Advanced Practice Midwife

## 2023-10-15 ENCOUNTER — Telehealth (HOSPITAL_COMMUNITY): Payer: Self-pay | Admitting: *Deleted

## 2023-10-15 NOTE — Telephone Encounter (Signed)
 10/15/2023  Name: Crystal Jennings MRN: [id number] DOB: 05/14/2000  Reason for Call:  Transition of Care Hospital Discharge Call  Contact Status: Patient Contact Status: Complete  Language assistant needed: Interpreter Mode: Interpreter Not Needed        Follow-Up Questions: Do You Have Any Concerns About Your Health As You Heal From Delivery?: No Do You Have Any Concerns About Your Infants Health?: No  Edinburgh Postnatal Depression Scale:  In the Past 7 Days: I have been able to laugh and see the funny side of things.: As much as I always could I have looked forward with enjoyment to things.: As much as I ever did I have blamed myself unnecessarily when things went wrong.: Yes, some of the time I have been anxious or worried for no good reason.: Yes, sometimes I have felt scared or panicky for no good reason.: Yes, sometimes Things have been getting on top of me.: No, most of the time I have coped quite well I have been so unhappy that I have had difficulty sleeping.: Not very often I have felt sad or miserable.: Not very often I have been so unhappy that I have been crying.: Only occasionally The thought of harming myself has occurred to me.: Never [place] Postnatal Depression Scale Total: (!) 10  PHQ2-9 Depression Scale:     Discharge Follow-up: Edinburgh score requires follow up?: Yes (patient says she takes medication for depression and is feeling better, today her answer to question ten was 0-Never (had been 1-Hardly Ever in the hospital), she declines IBH referral) Provider notified of Edinburgh score?: No Have you already been referred for a counseling appointment?: No Patient was advised of the following resources:: Support Group, Breastfeeding Support Group  Post-discharge interventions: Reviewed Newborn Safe Sleep Practices Maternal Mental Health Resources provided  Mliss Sieve, RN 10/15/2023 16:16

## 2023-10-28 ENCOUNTER — Encounter: Payer: Self-pay | Admitting: Obstetrics and Gynecology

## 2023-10-28 ENCOUNTER — Ambulatory Visit: Admitting: Obstetrics and Gynecology

## 2023-10-28 ENCOUNTER — Telehealth: Payer: Self-pay

## 2023-10-28 ENCOUNTER — Telehealth: Payer: Self-pay | Admitting: Lactation Services

## 2023-10-28 ENCOUNTER — Ambulatory Visit: Payer: Self-pay | Admitting: Obstetrics and Gynecology

## 2023-10-28 VITALS — BP 117/79 | HR 84 | Ht 62.0 in | Wt 191.0 lb

## 2023-10-28 DIAGNOSIS — F32A Depression, unspecified: Secondary | ICD-10-CM

## 2023-10-28 DIAGNOSIS — B353 Tinea pedis: Secondary | ICD-10-CM | POA: Diagnosis not present

## 2023-10-28 DIAGNOSIS — N39498 Other specified urinary incontinence: Secondary | ICD-10-CM

## 2023-10-28 DIAGNOSIS — Z975 Presence of (intrauterine) contraceptive device: Secondary | ICD-10-CM | POA: Insufficient documentation

## 2023-10-28 LAB — POCT URINALYSIS DIPSTICK
Bilirubin, UA: NEGATIVE
Glucose, UA: NEGATIVE
Nitrite, UA: NEGATIVE
Protein, UA: NEGATIVE
Spec Grav, UA: 1.015 (ref 1.010–1.025)
Urobilinogen, UA: 0.2 U/dL
pH, UA: 5 (ref 5.0–8.0)

## 2023-10-28 MED ORDER — TRIAMCINOLONE ACETONIDE 0.1 % EX CREA
1.0000 | TOPICAL_CREAM | Freq: Two times a day (BID) | CUTANEOUS | 0 refills | Status: AC
Start: 2023-10-28 — End: 2023-11-27

## 2023-10-28 MED ORDER — HYDROXYZINE HCL 25 MG PO TABS
25.0000 mg | ORAL_TABLET | Freq: Four times a day (QID) | ORAL | 2 refills | Status: DC | PRN
Start: 2023-10-28 — End: 2023-11-28

## 2023-10-28 MED ORDER — KETOCONAZOLE 2 % EX CREA: 1.0000 | TOPICAL_CREAM | Freq: Two times a day (BID) | CUTANEOUS | 0 refills | Status: AC

## 2023-10-28 MED ORDER — SERTRALINE HCL 100 MG PO TABS
100.0000 mg | ORAL_TABLET | Freq: Every day | ORAL | 11 refills | Status: DC
Start: 2023-10-28 — End: 2023-11-28

## 2023-10-28 NOTE — Telephone Encounter (Signed)
-----   Message from Elenor Mole sent at 10/28/2023 10:35 AM EDT ----- Regarding: follow up Hello ladies! I saw this patient at [facility] for her pp visit. She is pumping and giving formula. I think she would like to latch and sometimes try but feels it's a losing battle after giving the baby lots of bottles. She is pumping though & would like to increase her supply (she's only pumping 3 times a day, which we talked about increasing). She's a first time mom, has A LOT personally going on & is struggling with depression. Could someone call her and follow up? She didn't seem super thrilled about an appt, I think honestly because she's unsure it would be worth her time/helpful but was interested in a phone call. 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

## 2023-10-28 NOTE — Telephone Encounter (Addendum)
 Opened in error

## 2023-10-28 NOTE — Progress Notes (Signed)
 Pumping breasts and formula. Issues with urination since delivery. Valeen aware of Edinburgh Score.   Post Partum Visit Note  Crystal Jennings is a 23 y.o. G62P1001 female who presents for a postpartum visit. She is 4 week postpartum following a normal spontaneous vaginal delivery.  I have fully reviewed the prenatal and intrapartum course. The delivery was at 38.4 gestational weeks.  Anesthesia: epidural. Postpartum course has been well. Baby is doing well. Baby is feeding by both breast and bottle - Similac Advance. Bleeding thin lochia. Bowel function is normal. Bladder function is abnormal: feels like drizzling constantly. Patient is not sexually active. Contraception method is Nexplanon . Postpartum depression screening: positive.   The pregnancy intention screening data noted above was reviewed. Potential methods of contraception were discussed. The patient elected to proceed with No data recorded.    Health Maintenance Due  Topic Date Due   HPV VACCINES (1 - 3-dose series) Never done   Meningococcal B Vaccine (1 of 2 - Standard) Never done   Hepatitis B Vaccines (1 of 3 - 19+ 3-dose series) Never done   COVID-19 Vaccine (1 - 2024-25 season) Never done   INFLUENZA VACCINE  10/18/2023    The following portions of the patient's history were reviewed and updated as appropriate: allergies, current medications, past family history, past medical history, past social history, past surgical history, and problem list.  Review of Systems Pertinent items are noted in HPI.  Objective:  LMP 12/18/2022    General:  alert, cooperative, and fatigued   Breasts:  not indicated  Lungs: Normal rate  Heart:  Normal rate  Abdomen: Not indicated   Wound N/a  GU exam:  not indicated       Assessment:   1. Nexplanon  in place (Primary) -place in hospital  2. Depression, unspecified depression type -increased depression since delivery & numerous life stressors over the past year. Also  having intrusive thoughts are keeping her up at night. Will try hydroxyzine  prn for sleep/anxiety & increase in Zoloft  dose. No SI/HI. Discussed Behavorial Health Urgent Care & ER if needed. Declines BHC services at this time.  - sertraline  (ZOLOFT ) 100 MG tablet; Take 1 tablet (100 mg total) by mouth daily.  Dispense: 30 tablet; Refill: 11 - hydrOXYzine  (ATARAX ) 25 MG tablet; Take 1 tablet (25 mg total) by mouth every 6 (six) hours as needed for itching.  Dispense: 30 tablet; Refill: 2  3. Postpartum examination following vaginal delivery   4. Tinea pedis of left foot -mildly edematous multiple scaly patches to base of foot protruding to the sides and toenails with excoriation. Reports present the entire pregnancy. Reports severe itching. Has tried numerous home remedies without relief - triamcinolone  cream (KENALOG ) 0.1 %; Apply 1 Application topically 2 (two) times daily.  Dispense: 60 g; Refill: 0 - ketoconazole  (NIZORAL ) 2 % cream; Apply 1 Application topically 2 (two) times daily.  Dispense: 60 g; Refill: 0  5. Frequent urinary incontinence -Mixed incontinence since delivery. Some leuks on UA, will send for culture & referral to pelvic PT - POCT Urinalysis Dipstick - Urine Culture - Ambulatory referral to Physical Therapy  Plan:   Essential components of care per ACOG recommendations:  1.  Mood and well being: Patient with positive depression screening today. Reviewed local resources for support. Discussed [facility] services. Patient declines but is interested in increase Zoloft  dose. Also reports not sleeping well due to thoughts keeping her up at night. Will try hydroxyzine  prn and Zoloft  increase. Patient tobacco use?  No.   - hx of drug use? No.    2. Infant care and feeding:  -Patient currently breastmilk feeding? Yes. Reviewed importance of draining breast regularly to support lactation.  -Social determinants of health (SDOH) reviewed in EPIC. No concerns.  3. Sexuality,  contraception and birth spacing - Patient does not want a pregnancy in the next year.  Desired family size is unsure.  - Reviewed reproductive life planning. Reviewed contraceptive methods based on pt preferences and effectiveness.  Patient had Nexplanon  placed before hospital discharge.   - Discussed birth spacing of 18 months  4. Sleep and fatigue -Encouraged family/partner/community support of 4 hrs of uninterrupted sleep to help with mood and fatigue  5. Physical Recovery  - Discussed patients delivery and complications. She describes her labor as good. - Patient had a Vaginal, no problems at delivery. Patient had a 2nd degree laceration. Perineal healing reviewed. Patient expressed understanding - Patient has urinary incontinence? Yes. Discussed role of pelvic floor PT. Offered PT and patient accepted. Patient was referred to pelvic floor PT.  - Patient is safe to resume physical and sexual activity  6.  Health Maintenance - HM due items addressed Yes - Last pap smear  Diagnosis  Date Value Ref Range Status  07/22/2023   Final   - Negative for intraepithelial lesion or malignancy (NILM)   Pap smear not done at today's visit.  -Breast Cancer screening indicated? No.   7. Chronic Disease/Pregnancy Condition follow up: None - PCP follow up   Elenor Mole, [facility] 10/28/23

## 2023-10-28 NOTE — Telephone Encounter (Signed)
-----   Message from Elenor Mole sent at 10/28/2023 10:35 AM EDT ----- Regarding: follow up Hello ladies! I saw this patient at [facility] for her pp visit. She is pumping and giving formula. I think she would like to latch and sometimes try but feels it's a losing battle after giving the baby lots of bottles. She is pumping though & would like to increase her supply (she's only pumping 3 times a day, which we talked about increasing). She's a first time mom, has A LOT personally going on & is struggling with depression. Could someone call her and follow up? She didn't seem super thrilled about an appt, I think honestly because she's unsure it would be worth her time/helpful but was interested in a phone call. 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

## 2023-10-28 NOTE — Telephone Encounter (Signed)
  Called mom to assess breast feeding status and to offer and OP Lactation appointment.   No answer   LM for her to call the office at [phone] to leave a message for Lactation with any questions or concerns or to schedule an OP Lactation appointment.

## 2023-10-29 ENCOUNTER — Telehealth: Payer: Self-pay | Admitting: Lactation Services

## 2023-10-29 NOTE — Telephone Encounter (Signed)
-----   Message from Crystal Jennings sent at 10/28/2023 10:35 AM EDT ----- Regarding: follow up Hello ladies! I saw this patient at [facility] for her pp visit. She is pumping and giving formula. I think she would like to latch and sometimes try but feels it's a losing battle after giving the baby lots of bottles. She is pumping though & would like to increase her supply (she's only pumping 3 times a day, which we talked about increasing). She's a first time mom, has A LOT personally going on & is struggling with depression. Could someone call her and follow up? She didn't seem super thrilled about an appt, I think honestly because she's unsure it would be worth her time/helpful but was interested in a phone call. 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

## 2023-10-29 NOTE — Telephone Encounter (Signed)
 Called patient to follow up with concerns with for Lactation.   Patient reports she is at work right now and would like to call her back 4-4:30 after work, she prefers Spanish as a Counsellor.

## 2023-11-28 ENCOUNTER — Encounter: Payer: Self-pay | Admitting: Family Medicine

## 2023-11-28 ENCOUNTER — Ambulatory Visit: Admitting: Family Medicine

## 2023-11-28 DIAGNOSIS — F32A Depression, unspecified: Secondary | ICD-10-CM | POA: Diagnosis not present

## 2023-11-28 MED ORDER — SERTRALINE HCL 100 MG PO TABS: ORAL_TABLET | ORAL | 11 refills | Status: AC

## 2023-11-28 MED ORDER — HYDROXYZINE HCL 25 MG PO TABS: 25.0000 mg | ORAL_TABLET | Freq: Four times a day (QID) | ORAL | 2 refills | Status: AC | PRN

## 2023-11-28 NOTE — Progress Notes (Signed)
 Post Partum Visit Note  Crystal Jennings is a 23 y.o. G16P1001 female who presents for a postpartum visit. She is 8 weeks postpartum following a normal spontaneous vaginal delivery.  I have fully reviewed the prenatal and intrapartum course. The delivery was at [redacted]w[redacted]d gestational weeks.  Anesthesia: local and epidural. Postpartum course has been challenging. Baby is doing well. Baby is feeding by both breast and bottle - Similac Advance. Bleeding staining only. Bowel function is normal. Bladder function is abnormal:leaking urine. Patient is not sexually active. Contraception method is Nexplanon . Postpartum depression screening: positive. Zoloft  was increased to 100 mg at last visit. She reports that the Zoloft  has been helpful on the days that she remembers to take it, but she has only remembered 3-4 days of the week. She has not had a chance to pick up the hydroxyzine  that was prescribed at last visit. She notes that she has though of harming herself but denies having any plans to do so.   The pregnancy intention screening data noted above was reviewed. Potential methods of contraception were discussed. The patient elected to proceed with No data recorded.   Edinburgh Postnatal Depression Scale - 11/28/23 1107       Edinburgh Postnatal Depression Scale:  In the Past 7 Days   I have been able to laugh and see the funny side of things. 2    I have looked forward with enjoyment to things. 1    I have blamed myself unnecessarily when things went wrong. 3    I have been anxious or worried for no good reason. 3    I have felt scared or panicky for no good reason. 3    Things have been getting on top of me. 3    I have been so unhappy that I have had difficulty sleeping. 3    I have felt sad or miserable. 3    I have been so unhappy that I have been crying. 3    The thought of harming myself has occurred to me. 2    Edinburgh Postnatal Depression Scale Total 26            09/16/2023     1:37 PM  Depression screen PHQ 2/9  Decreased Interest 0  Down, Depressed, Hopeless 0  PHQ - 2 Score 0  Altered sleeping 2  Tired, decreased energy 1  Change in appetite 1  Feeling bad or failure about yourself  0  Trouble concentrating 1  Moving slowly or fidgety/restless 0  Suicidal thoughts 0  PHQ-9 Score 5  Difficult doing work/chores Not difficult at all    Health Maintenance Due  Topic Date Due   HPV VACCINES (1 - 3-dose series) Never done   Meningococcal B Vaccine (1 of 2 - Standard) Never done   Hepatitis B Vaccines 19-59 Average Risk (1 of 3 - 19+ 3-dose series) Never done   Influenza Vaccine  Never done   COVID-19 Vaccine (1 - 2024-25 season) Never done    The following portions of the patient's history were reviewed and updated as appropriate: allergies, current medications, past family history, past medical history, past social history, past surgical history, and problem list.  Review of Systems Pertinent items are noted in HPI.  Objective:  BP 128/80   Pulse (!) 108   Ht 5' 2 (1.575 m)   Wt 202 lb (91.6 kg)   Breastfeeding Yes   BMI 36.95 kg/m    Constitutional: Well-developed, well-nourished female  in no acute distress.  HEENT: atraumatic, normocephalic. Neck has normal ROM. EOM intact. Cardiovascular: normal rate & rhythm, warm and well-perfused Respiratory: normal effort, no problems with respiration noted MSK: Extremities nontender, no edema, normal ROM Skin: Acyanotic, no jaundice or pallor. Neurologic: Alert and oriented x 4. No abnormal coordination. Psychiatric: Normal mood. Speech not slurred, not rapid/pressured. Patient is cooperative.       Assessment:  1. Postpartum care and examination (Primary)  2. Depression, unspecified depression type - hydrOXYzine  (ATARAX ) 25 MG tablet; Take 1 tablet (25 mg total) by mouth every 6 (six) hours as needed for itching.  Dispense: 30 tablet; Refill: 2 - sertraline  (ZOLOFT ) 100 MG tablet; Take 150 mg  (1 tablet) by mouth daily. If needed, can increase to 200 mg (2 tablets) after 7 days.  Dispense: 30 tablet; Refill: 11   Plan:  Patient with positive depression screening today. Edinburgh score 26. Increase sertraline  to 150 mg daily, take nightly at bedtime to increase likelihood of taking daily dose. If needed, can increase dose to sertraline  200 mg after 7 days. Resending hydroxyzine  to use prn for anxiety. Encouraged to call or go to [facility] Urgent Care & ER. Offered to connect with LCSW for additional support, patient declines at this time but will consider it. Close follow up in 2 weeks for mood check.   Joesph DELENA Sear, PA Center for [employer], [facility]

## 2023-12-09 ENCOUNTER — Encounter: Payer: Self-pay | Admitting: Licensed Clinical Social Worker

## 2023-12-12 ENCOUNTER — Ambulatory Visit: Admitting: Physician Assistant

## 2023-12-12 ENCOUNTER — Ambulatory Visit (INDEPENDENT_AMBULATORY_CARE_PROVIDER_SITE_OTHER)

## 2023-12-12 VITALS — BP 116/79 | HR 77 | Wt 200.7 lb

## 2023-12-12 DIAGNOSIS — F32A Depression, unspecified: Secondary | ICD-10-CM

## 2023-12-12 DIAGNOSIS — Z1331 Encounter for screening for depression: Secondary | ICD-10-CM | POA: Diagnosis not present

## 2023-12-12 NOTE — Progress Notes (Signed)
 Pt is in the office for nurse visit related to mood check. At previous appt on 11/28/23, pt scored a 26 on Edinburgh and she was started on Hydroxyzine  and Zoloft  increased to 150mg . Pt scored a 20 on [place] today and stated that she has trouble remembering to take her meds, missing 2-3 days per week. Pt stated that she has occasionally thought about harming herself but never attempted or developed a plan. Pt stated that she does not want to see counselor or go to Behavioral Health at this time. Consulted with Nidia, FNP in office and FNP spoke with and evaluated the pt.

## 2023-12-13 ENCOUNTER — Encounter: Admitting: Licensed Clinical Social Worker

## 2023-12-19 ENCOUNTER — Ambulatory Visit: Attending: Obstetrics and Gynecology | Admitting: Physical Therapy

## 2023-12-23 ENCOUNTER — Ambulatory Visit: Payer: Self-pay | Admitting: Licensed Clinical Social Worker

## 2023-12-23 DIAGNOSIS — R69 Illness, unspecified: Secondary | ICD-10-CM

## 2023-12-23 NOTE — BH Specialist Note (Signed)
 A user error has taken place: encounter opened in error, closed for administrative reasons.  [facility] contacted to reach patient on this date to check in and schedule [facility] appointment. [facility] left a message. Contact number [phone].

## 2024-01-01 ENCOUNTER — Ambulatory Visit: Admitting: Obstetrics and Gynecology

## 2024-03-03 ENCOUNTER — Emergency Department (HOSPITAL_COMMUNITY)

## 2024-03-03 ENCOUNTER — Emergency Department (HOSPITAL_COMMUNITY)
Admission: EM | Admit: 2024-03-03 | Discharge: 2024-03-04 | Disposition: A | Attending: Emergency Medicine | Admitting: Emergency Medicine

## 2024-03-03 ENCOUNTER — Encounter (HOSPITAL_COMMUNITY): Payer: Self-pay

## 2024-03-03 ENCOUNTER — Other Ambulatory Visit: Payer: Self-pay

## 2024-03-03 DIAGNOSIS — W19XXXA Unspecified fall, initial encounter: Secondary | ICD-10-CM

## 2024-03-03 DIAGNOSIS — S6992XA Unspecified injury of left wrist, hand and finger(s), initial encounter: Secondary | ICD-10-CM | POA: Diagnosis present

## 2024-03-03 DIAGNOSIS — S20219A Contusion of unspecified front wall of thorax, initial encounter: Secondary | ICD-10-CM

## 2024-03-03 DIAGNOSIS — S61313A Laceration without foreign body of left middle finger with damage to nail, initial encounter: Secondary | ICD-10-CM

## 2024-03-03 DIAGNOSIS — Y92009 Unspecified place in unspecified non-institutional (private) residence as the place of occurrence of the external cause: Secondary | ICD-10-CM | POA: Diagnosis not present

## 2024-03-03 DIAGNOSIS — S20211A Contusion of right front wall of thorax, initial encounter: Secondary | ICD-10-CM | POA: Diagnosis not present

## 2024-03-03 DIAGNOSIS — W260XXA Contact with knife, initial encounter: Secondary | ICD-10-CM | POA: Diagnosis not present

## 2024-03-03 DIAGNOSIS — Y93G1 Activity, food preparation and clean up: Secondary | ICD-10-CM | POA: Diagnosis not present

## 2024-03-03 LAB — CBC
HCT: 45.2 % (ref 36.0–46.0)
Hemoglobin: 15.5 g/dL — ABNORMAL HIGH (ref 12.0–15.0)
MCH: 28.7 pg (ref 26.0–34.0)
MCHC: 34.3 g/dL (ref 30.0–36.0)
MCV: 83.7 fL (ref 80.0–100.0)
Platelets: 508 K/uL — ABNORMAL HIGH (ref 150–400)
RBC: 5.4 MIL/uL — ABNORMAL HIGH (ref 3.87–5.11)
RDW: 13.5 % (ref 11.5–15.5)
WBC: 12.5 K/uL — ABNORMAL HIGH (ref 4.0–10.5)
nRBC: 0 % (ref 0.0–0.2)

## 2024-03-03 LAB — BASIC METABOLIC PANEL WITH GFR
Anion gap: 16 — ABNORMAL HIGH (ref 5–15)
BUN: 6 mg/dL (ref 6–20)
CO2: 23 mmol/L (ref 22–32)
Calcium: 10 mg/dL (ref 8.9–10.3)
Chloride: 104 mmol/L (ref 98–111)
Creatinine, Ser: 0.87 mg/dL (ref 0.44–1.00)
GFR, Estimated: >60 mL/min (ref >60)
Glucose, Bld: 91 mg/dL (ref 70–99)
Potassium: 4.6 mmol/L (ref 3.5–5.1)
Sodium: 142 mmol/L (ref 135–145)

## 2024-03-03 LAB — HCG, SERUM, QUALITATIVE: Preg, Serum: NEGATIVE

## 2024-03-03 LAB — D-DIMER, QUANTITATIVE: D-Dimer, Quant: <0.27 ug{FEU}/mL (ref 0.00–0.50)

## 2024-03-03 LAB — TROPONIN T, HIGH SENSITIVITY: Troponin T High Sensitivity: <15 ng/L (ref 0–19)

## 2024-03-03 MED ORDER — LACTATED RINGERS IV BOLUS
1000.0000 mL | Freq: Once | INTRAVENOUS | Status: AC
  Administered 2024-03-03: 21:00:00 1000 mL via INTRAVENOUS

## 2024-03-03 MED ORDER — CEPHALEXIN 500 MG PO CAPS: 500.0000 mg | ORAL_CAPSULE | Freq: Four times a day (QID) | ORAL | 0 refills | Status: AC

## 2024-03-03 MED ORDER — HYDROMORPHONE HCL 1 MG/ML IJ SOLN
1.0000 mg | Freq: Once | INTRAMUSCULAR | Status: AC
  Administered 2024-03-03: 1 mg via INTRAVENOUS
  Filled 2024-03-03: qty 1

## 2024-03-03 MED ORDER — ONDANSETRON HCL 4 MG/2ML IJ SOLN
4.0000 mg | Freq: Once | INTRAMUSCULAR | Status: AC
  Administered 2024-03-03: 21:00:00 4 mg via INTRAVENOUS
  Filled 2024-03-03: qty 2

## 2024-03-03 MED ORDER — LACTATED RINGERS IV BOLUS
1000.0000 mL | Freq: Once | INTRAVENOUS | Status: AC
  Administered 2024-03-03: 1000 mL via INTRAVENOUS

## 2024-03-03 MED ORDER — FENTANYL CITRATE (PF) 50 MCG/ML IJ SOSY
50.0000 ug | PREFILLED_SYRINGE | Freq: Once | INTRAMUSCULAR | Status: AC
  Administered 2024-03-03: 21:00:00 50 ug via INTRAVENOUS
  Filled 2024-03-03: qty 1

## 2024-03-03 MED ORDER — OXYCODONE-ACETAMINOPHEN 5-325 MG PO TABS
1.0000 | ORAL_TABLET | Freq: Once | ORAL | Status: AC
  Administered 2024-03-04: 01:00:00 1 via ORAL
  Filled 2024-03-03: qty 1

## 2024-03-03 MED ORDER — IOHEXOL 350 MG/ML SOLN
75.0000 mL | Freq: Once | INTRAVENOUS | Status: AC | PRN
  Administered 2024-03-03: 22:00:00 75 mL via INTRAVENOUS

## 2024-03-03 NOTE — ED Provider Triage Note (Signed)
 Emergency Medicine Provider Triage Evaluation Note  Crystal Jennings , a 23 y.o. female  was evaluated in triage.  Pt complains of pleuritic chest pain.  Patient reports that she fell down some stairs on Saturday and most of the impact of the fall was to her anterior chest, she denies head injury or loss of conscious at the time of the injury.  She also notes that on Saturday she excellently cut a finger on her left hand while cooking dinner, she has not been seen for this injury.  She complains today of pleuritic chest pain, when she is not taking a deep breath she does not have any chest pain.  She does not take any medications daily but does have Nexplanon  in place.  Denies fever, cough.  Review of Systems  Positive: As above Negative: As above  Physical Exam  BP (!) 132/102   Pulse (!) 131   Temp 97.7 F (36.5 C)   Resp 18   SpO2 98%  Gen:   Awake, no distress   Resp:  Normal effort  MSK:   Moves extremities without difficulty  Other:    Medical Decision Making  Medically screening exam initiated at 3:42 PM.  Appropriate orders placed.  Crystal Jennings was informed that the remainder of the evaluation will be completed by another provider, this initial triage assessment does not replace that evaluation, and the importance of remaining in the ED until their evaluation is complete.     Crystal Jennings, [place] 03/03/24 1544

## 2024-03-03 NOTE — ED Triage Notes (Signed)
 Quick triage note: Pt reports tripped down stairs at home on Saturday, landing on front of body. . Pt now c/o right rib pain.   Pt also reports on Saturday she cut herself on left middle finger with a knife while cooking. Unknown last tetanus shot. Bleeding controlled.

## 2024-03-03 NOTE — ED Provider Notes (Signed)
 [place] EMERGENCY DEPARTMENT AT [facility] Provider Note   CSN: [id number] Arrival date & time: 03/03/24  1511     Patient presents with: Fall and Finger Injury (Left middle)   Crystal Jennings is a 23 y.o. female.  {Add pertinent medical, surgical, social history, OB history to YEP:67052}  Fall     23 year old female presenting to the emergency department with a chief complaint of chest pain in the setting of a fall.  The patient states that she fell down the stairs on Saturday and most the impact of the fall was to her anterior chest.  She has been having chest wall tenderness to palpation along the sternum as well as the right side of the chest.  She also cut her finger, left middle finger on a knife while cooking dinner sustaining injury to the nailbed.  She has not been seen for this injury.  She has not been on antibiotics.  Unsure last tetanus status however on chart review it appears to have been administered in 2022 and 2025.  No redness or swelling of the finger, appears to be healing.  She is primarily here due to concern for significant pain in her chest.  Prior to Admission medications  Medication Sig Start Date End Date Taking? Authorizing Provider  acetaminophen  (TYLENOL ) 325 MG tablet Take 2 tablets (650 mg total) by mouth every 4 (four) hours as needed (for pain scale < 4). 10/01/23   Terri Lacks, MD  hydrOXYzine  (ATARAX ) 25 MG tablet Take 1 tablet (25 mg total) by mouth every 6 (six) hours as needed for itching. 11/28/23   Wallace Joesph LABOR, PA  ibuprofen  (ADVIL ) 600 MG tablet Take 1 tablet (600 mg total) by mouth every 6 (six) hours. Patient not taking: Reported on 11/28/2023 10/01/23   Terri Lacks, MD  ketoconazole  (NIZORAL ) 2 % cream Apply 1 Application topically 2 (two) times daily. 10/28/23   Delores Nidia CROME, FNP  Prenatal Vit-Fe Fumarate-FA (PRENATAL MULTIVITAMIN) TABS tablet Take 1 tablet by mouth daily at 12 noon. 10/01/23   Terri Lacks, MD  Prenatal Vit-Fe Fumarate-FA (PREPLUS) 27-1 MG TABS Take 1 tablet by mouth daily. 07/22/23   Rudy Carlin LABOR, MD  senna-docusate (SENOKOT-S) 8.6-50 MG tablet Take 2 tablets by mouth daily. Patient not taking: Reported on 11/28/2023 10/01/23   Terri Lacks, MD  sertraline  (ZOLOFT ) 100 MG tablet Take 150 mg (1 tablet) by mouth daily. If needed, can increase to 200 mg (2 tablets) after 7 days. 11/28/23   Wallace Joesph LABOR, PA    Allergies: Patient has no known allergies.    Review of Systems  Updated Vital Signs BP (!) 132/102   Pulse (!) 131   Temp 97.7 F (36.5 C)   Resp 18   SpO2 98%   Physical Exam  (all labs ordered are listed, but only abnormal results are displayed) Labs Reviewed  CBC - Abnormal; Notable for the following components:      Result Value   WBC 12.5 (*)    RBC 5.40 (*)    Hemoglobin 15.5 (*)    Platelets 508 (*)    All other components within normal limits  BASIC METABOLIC PANEL WITH GFR - Abnormal; Notable for the following components:   Anion gap 16 (*)    All other components within normal limits  D-DIMER, QUANTITATIVE  HCG, SERUM, QUALITATIVE  TROPONIN T, HIGH SENSITIVITY    EKG: None  Radiology: DG Chest 2 View Result Date: 03/03/2024 EXAM: 2  VIEW(S) XRAY OF THE CHEST 03/03/2024 04:51:10 PM COMPARISON: Comparison with 03/28/2015. CLINICAL HISTORY: pain with deep breathing FINDINGS: LUNGS AND PLEURA: Shallow inspiration. Lungs are clear. No pleural effusion or pneumothorax. HEART AND MEDIASTINUM: Heart size and pulmonary vascularity are normal. Mediastinal contours appear intact. BONES AND SOFT TISSUES: No acute osseous abnormality. IMPRESSION: 1. No acute cardiopulmonary abnormality. Electronically signed by: Elsie Gravely MD 03/03/2024 04:53 PM EST RP Workstation: [id number]    {Document cardiac monitor, telemetry assessment procedure when appropriate:32947} Procedures   Medications Ordered in the ED - No data to display  Clinical  Course as of 03/03/24 2034  Tue Mar 03, 2024  2025 WBC(!): 12.5 [JL]  2025 Pulse Rate(!): 116 [JL]    Clinical Course User Index [JL] Jerrol Agent, MD   {Click here for ABCD2, HEART and other calculators REFRESH Note before signing:1}                              Medical Decision Making Amount and/or Complexity of Data Reviewed Labs:  Decision-making details documented in ED Course. Radiology: ordered.  Risk Prescription drug management.   ***  {Document critical care time when appropriate  Document review of labs and clinical decision tools ie CHADS2VASC2, etc  Document your independent review of radiology images and any outside records  Document your discussion with family members, caretakers and with consultants  Document social determinants of health affecting pt's care  Document your decision making why or why not admission, treatments were needed:32947:::1}   Final diagnoses:  None    ED Discharge Orders     None

## 2024-03-03 NOTE — ED Triage Notes (Signed)
 Verbal consent given for MSE

## 2024-03-04 LAB — TROPONIN T, HIGH SENSITIVITY: Troponin T High Sensitivity: <15 ng/L (ref 0–19)

## 2024-03-04 NOTE — ED Provider Notes (Signed)
 I took over care of this patient at 51 AM pending remainder of workup.  Second troponin is negative.  She appears well with no complaints at this time Physical Exam  BP 126/89 (BP Location: Right Arm)   Pulse (!) 104   Temp 98.4 F (36.9 C) (Oral)   Resp (!) 25   Ht 5' 2 (1.575 m)   Wt 91 kg   SpO2 100%   BMI 36.69 kg/m   Physical Exam Vitals and nursing note reviewed.  Constitutional:      General: She is not in acute distress.    Appearance: She is well-developed.  HENT:     Head: Normocephalic and atraumatic.  Eyes:     Conjunctiva/sclera: Conjunctivae normal.  Cardiovascular:     Rate and Rhythm: Normal rate and regular rhythm.     Heart sounds: No murmur heard. Pulmonary:     Effort: Pulmonary effort is normal. No respiratory distress.     Breath sounds: Normal breath sounds.  Abdominal:     Palpations: Abdomen is soft.     Tenderness: There is no abdominal tenderness.  Musculoskeletal:        General: No swelling.     Cervical back: Neck supple.  Skin:    General: Skin is warm and dry.     Capillary Refill: Capillary refill takes less than 2 seconds.  Neurological:     Mental Status: She is alert.  Psychiatric:        Mood and Affect: Mood normal.     Procedures  Procedures  ED Course / MDM   Clinical Course as of 03/04/24 0208  Tue Mar 03, 2024  2025 WBC(!): 12.5 [JL]  2025 Pulse Rate(!): 116 [JL]    Clinical Course User Index [JL] Jerrol Agent, MD   Medical Decision Making Patient with negative workup and second troponin is negative.  Vitals are stable.  She will be discharged home.  Advise close up with primary care otherwise return to the ER for new or worsening symptoms.  She feels comfortable being discharged.  Amount and/or Complexity of Data Reviewed Labs:  Decision-making details documented in ED Course. Radiology: ordered.  Risk Prescription drug management.          Gennaro Duwaine CROME, DO 03/04/24 [phone]
